# Patient Record
Sex: Female | Born: 1995 | Hispanic: Yes | Marital: Single | Smoking: Never smoker
Health system: Southern US, Community
[De-identification: ages and names within clinical notes are randomized; demographics above are authoritative.]

## PROBLEM LIST (undated history)

## (undated) ENCOUNTER — Inpatient Hospital Stay (HOSPITAL_COMMUNITY): Payer: Self-pay

## (undated) DIAGNOSIS — J45909 Unspecified asthma, uncomplicated: Secondary | ICD-10-CM

## (undated) DIAGNOSIS — R51 Headache: Secondary | ICD-10-CM

## (undated) DIAGNOSIS — R519 Headache, unspecified: Secondary | ICD-10-CM

## (undated) HISTORY — DX: Headache, unspecified: R51.9

## (undated) HISTORY — DX: Headache: R51

## (undated) HISTORY — PX: NO PAST SURGERIES: SHX2092

## (undated) HISTORY — DX: Unspecified asthma, uncomplicated: J45.909

---

## 2015-04-04 NOTE — L&D Delivery Note (Signed)
Patient is 20 y.o. G1P0 8531w1d admitted for contractions and rupture of membranes.   Delivery Note At 5:22 AM a viable female was delivered via Vaginal, Spontaneous Delivery (Presentation: Right Occiput Anterior).  APGAR: 9, 9; weight pending .   Placenta status: Intact, Spontaneous.  Cord: 3 vessels with the following complications: None.    Continued bleeding shortly after delivery, total EBL 600. I repaired a bleeding superficial perineal laceration. I swept the uterus twice, removing clot and small amount of placenta. I/O cath returned 250 cc urine. cytotec 800 buccal, methergine 0.2 mg IM, and hemagate 0.25 mg IM given. SSE revealed no cervical lacerations. Fundus firm. Will continue to monitor.  Anesthesia: Epidural  Episiotomy: None Lacerations:  1st Degree Suture Repair: 0 vicryl Est. Blood Loss (mL):  600  Mom to postpartum.  Baby to Couplet care / Skin to Skin.  [facility] 08/22/2015, 5:34 AM    OB FELLOW DELIVERY ATTESTATION  I was gloved and present for the delivery in its entirety, and I agree with the above resident's note.  i performed the repair and managed the PPH.  Silvano BilisNoah B Lenna Hagarty, MD 10:29 AM

## 2015-05-20 ENCOUNTER — Ambulatory Visit (INDEPENDENT_AMBULATORY_CARE_PROVIDER_SITE_OTHER): Payer: Medicaid Other

## 2015-05-20 DIAGNOSIS — Z3201 Encounter for pregnancy test, result positive: Secondary | ICD-10-CM | POA: Diagnosis not present

## 2015-05-20 DIAGNOSIS — Z3200 Encounter for pregnancy test, result unknown: Secondary | ICD-10-CM

## 2015-05-20 NOTE — Progress Notes (Signed)
Pt comes in today for pregnancy. Test has confirmed she is pregnant. LMP 11/27/2014. Pt wishes to start care here appt has been made.

## 2015-06-15 ENCOUNTER — Ambulatory Visit (INDEPENDENT_AMBULATORY_CARE_PROVIDER_SITE_OTHER): Payer: Medicaid Other | Admitting: Student

## 2015-06-15 ENCOUNTER — Encounter: Payer: Self-pay | Admitting: Student

## 2015-06-15 VITALS — BP 99/64 | HR 94 | Temp 98.6°F | Ht 62.0 in | Wt 139.4 lb

## 2015-06-15 DIAGNOSIS — Z23 Encounter for immunization: Secondary | ICD-10-CM

## 2015-06-15 DIAGNOSIS — O0933 Supervision of pregnancy with insufficient antenatal care, third trimester: Secondary | ICD-10-CM | POA: Diagnosis not present

## 2015-06-15 LAB — POCT URINALYSIS DIP (DEVICE)
Bilirubin Urine: NEGATIVE
Glucose, UA: NEGATIVE mg/dL
Hgb urine dipstick: NEGATIVE
Ketones, ur: NEGATIVE mg/dL
Nitrite: NEGATIVE
Protein, ur: NEGATIVE mg/dL
Specific Gravity, Urine: 1.015 (ref 1.005–1.030)
Urobilinogen, UA: 0.2 mg/dL (ref 0.0–1.0)
pH: 6.5 (ref 5.0–8.0)

## 2015-06-15 MED ORDER — TETANUS-DIPHTH-ACELL PERTUSSIS 5-2.5-18.5 LF-MCG/0.5 IM SUSP
0.5000 mL | Freq: Once | INTRAMUSCULAR | Status: AC
  Administered 2015-06-15: 0.5 mL via INTRAMUSCULAR

## 2015-06-15 NOTE — Patient Instructions (Signed)
Third Trimester of Pregnancy The third trimester is from week 29 through week 42, months 7 through 9. The third trimester is a time when the fetus is growing rapidly. At the end of the ninth month, the fetus is about 20 inches in length and weighs 6-10 pounds.  BODY CHANGES Your body goes through many changes during pregnancy. The changes vary from woman to woman.   Your weight will continue to increase. You can expect to gain 25-35 pounds (11-16 kg) by the end of the pregnancy.  You may begin to get stretch marks on your hips, abdomen, and breasts.  You may urinate more often because the fetus is moving lower into your pelvis and pressing on your bladder.  You may develop or continue to have heartburn as a result of your pregnancy.  You may develop constipation because certain hormones are causing the muscles that push waste through your intestines to slow down.  You may develop hemorrhoids or swollen, bulging veins (varicose veins).  You may have pelvic pain because of the weight gain and pregnancy hormones relaxing your joints between the bones in your pelvis. Backaches may result from overexertion of the muscles supporting your posture.  You may have changes in your hair. These can include thickening of your hair, rapid growth, and changes in texture. Some women also have hair loss during or after pregnancy, or hair that feels dry or thin. Your hair will most likely return to normal after your baby is born.  Your breasts will continue to grow and be tender. A yellow discharge may leak from your breasts called colostrum.  Your belly button may stick out.  You may feel short of breath because of your expanding uterus.  You may notice the fetus "dropping," or moving lower in your abdomen.  You may have a bloody mucus discharge. This usually occurs a few days to a week before labor begins.  Your cervix becomes thin and soft (effaced) near your due date. WHAT TO EXPECT AT YOUR PRENATAL  EXAMS  You will have prenatal exams every 2 weeks until week 36. Then, you will have weekly prenatal exams. During a routine prenatal visit:  You will be weighed to make sure you and the fetus are growing normally.  Your blood pressure is taken.  Your abdomen will be measured to track your baby's growth.  The fetal heartbeat will be listened to.  Any test results from the previous visit will be discussed.  You may have a cervical check near your due date to see if you have effaced. At around 36 weeks, your caregiver will check your cervix. At the same time, your caregiver will also perform a test on the secretions of the vaginal tissue. This test is to determine if a type of bacteria, Group B streptococcus, is present. Your caregiver will explain this further. Your caregiver may ask you:  What your birth plan is.  How you are feeling.  If you are feeling the baby move.  If you have had any abnormal symptoms, such as leaking fluid, bleeding, severe headaches, or abdominal cramping.  If you are using any tobacco products, including cigarettes, chewing tobacco, and electronic cigarettes.  If you have any questions. Other tests or screenings that may be performed during your third trimester include:  Blood tests that check for low iron levels (anemia).  Fetal testing to check the health, activity level, and growth of the fetus. Testing is done if you have certain medical conditions or if   there are problems during the pregnancy.  HIV (human immunodeficiency virus) testing. If you are at high risk, you may be screened for HIV during your third trimester of pregnancy. FALSE LABOR You may feel small, irregular contractions that eventually go away. These are called Braxton Hicks contractions, or false labor. Contractions may last for hours, days, or even weeks before true labor sets in. If contractions come at regular intervals, intensify, or become painful, it is best to be seen by your  caregiver.  SIGNS OF LABOR   Menstrual-like cramps.  Contractions that are 5 minutes apart or less.  Contractions that start on the top of the uterus and spread down to the lower abdomen and back.  A sense of increased pelvic pressure or back pain.  A watery or bloody mucus discharge that comes from the vagina. If you have any of these signs before the 37th week of pregnancy, call your caregiver right away. You need to go to the hospital to get checked immediately. HOME CARE INSTRUCTIONS   Avoid all smoking, herbs, alcohol, and unprescribed drugs. These chemicals affect the formation and growth of the baby.  Do not use any tobacco products, including cigarettes, chewing tobacco, and electronic cigarettes. If you need help quitting, ask your health care provider. You may receive counseling support and other resources to help you quit.  Follow your caregiver's instructions regarding medicine use. There are medicines that are either safe or unsafe to take during pregnancy.  Exercise only as directed by your caregiver. Experiencing uterine cramps is a good sign to stop exercising.  Continue to eat regular, healthy meals.  Wear a good support bra for breast tenderness.  Do not use hot tubs, steam rooms, or saunas.  Wear your seat belt at all times when driving.  Avoid raw meat, uncooked cheese, cat litter boxes, and soil used by cats. These carry germs that can cause birth defects in the baby.  Take your prenatal vitamins.  Take 1500-2000 mg of calcium daily starting at the 20th week of pregnancy until you deliver your baby.  Try taking a stool softener (if your caregiver approves) if you develop constipation. Eat more high-fiber foods, such as fresh vegetables or fruit and whole grains. Drink plenty of fluids to keep your urine clear or pale yellow.  Take warm sitz baths to soothe any pain or discomfort caused by hemorrhoids. Use hemorrhoid cream if your caregiver approves.  If  you develop varicose veins, wear support hose. Elevate your feet for 15 minutes, 3-4 times a day. Limit salt in your diet.  Avoid heavy lifting, wear low heal shoes, and practice good posture.  Rest a lot with your legs elevated if you have leg cramps or low back pain.  Visit your dentist if you have not gone during your pregnancy. Use a soft toothbrush to brush your teeth and be gentle when you floss.  A sexual relationship may be continued unless your caregiver directs you otherwise.  Do not travel far distances unless it is absolutely necessary and only with the approval of your caregiver.  Take prenatal classes to understand, practice, and ask questions about the labor and delivery.  Make a trial run to the hospital.  Pack your hospital bag.  Prepare the baby's nursery.  Continue to go to all your prenatal visits as directed by your caregiver. SEEK MEDICAL CARE IF:  You are unsure if you are in labor or if your water has broken.  You have dizziness.  You have  mild pelvic cramps, pelvic pressure, or nagging pain in your abdominal area.  You have persistent nausea, vomiting, or diarrhea.  You have a bad smelling vaginal discharge.  You have pain with urination. SEEK IMMEDIATE MEDICAL CARE IF:   You have a fever.  You are leaking fluid from your vagina.  You have spotting or bleeding from your vagina.  You have severe abdominal cramping or pain.  You have rapid weight loss or gain.  You have shortness of breath with chest pain.  You notice sudden or extreme swelling of your face, hands, ankles, feet, or legs.  You have not felt your baby move in over an hour.  You have severe headaches that do not go away with medicine.  You have vision changes.   This information is not intended to replace advice given to you by your health care provider. Make sure you discuss any questions you have with your health care provider.   Document Released: 03/14/2001 Document  Revised: 04/10/2014 Document Reviewed: 05/21/2012 Elsevier Interactive Patient Education 2016 [employer]   Safe Medications in Pregnancy   Acne: Benzoyl Peroxide Salicylic Acid  Backache/Headache: Tylenol: 2 regular strength every 4 hours OR              2 Extra strength every 6 hours  Colds/Coughs/Allergies: Benadryl (alcohol free) 25 mg every 6 hours as needed Breath right strips Claritin Cepacol throat lozenges Chloraseptic throat spray Cold-Eeze- up to three times per day Cough drops, alcohol free Flonase (by prescription only) Guaifenesin Mucinex Robitussin DM (plain only, alcohol free) Saline nasal spray/drops Sudafed (pseudoephedrine) & Actifed ** use only after [redacted] weeks gestation and if you do not have high blood pressure Tylenol Vicks Vaporub Zinc lozenges Zyrtec   Constipation: Colace Ducolax suppositories Fleet enema Glycerin suppositories Metamucil Milk of magnesia Miralax Senokot Smooth move tea  Diarrhea: Kaopectate Imodium A-D  *NO pepto Bismol  Hemorrhoids: Anusol Anusol HC Preparation H Tucks  Indigestion: Tums Maalox Mylanta Zantac  Pepcid  Insomnia: Benadryl (alcohol free) 25mg  every 6 hours as needed Tylenol PM Unisom, no Gelcaps  Leg Cramps: Tums MagGel  Nausea/Vomiting:  Bonine Dramamine Emetrol Ginger extract Sea bands Meclizine  Nausea medication to take during pregnancy:  Unisom (doxylamine succinate 25 mg tablets) Take one tablet daily at bedtime. If symptoms are not adequately controlled, the dose can be increased to a maximum recommended dose of two tablets daily (1/2 tablet in the morning, 1/2 tablet mid-afternoon and one at bedtime). Vitamin B6 100mg  tablets. Take one tablet twice a day (up to 200 mg per day).  Skin Rashes: Aveeno products Benadryl cream or 25mg  every 6 hours as needed Calamine Lotion 1% cortisone cream  Yeast infection: Gyne-lotrimin 7 Monistat 7  Gum/tooth  pain: Anbesol  **If taking multiple medications, please check labels to avoid duplicating the same active ingredients **take medication as directed on the label ** Do not exceed 4000 mg of tylenol in 24 hours **Do not take medications that contain aspirin or ibuprofen

## 2015-06-15 NOTE — Progress Notes (Signed)
  Subjective:    Kathleen Cooke is being seen today for her first obstetrical visit.  This is a planned pregnancy. She is at 8050w5d gestation. Her obstetrical history is significant for late to care. Relationship with FOB: not involved. Patient does intend to breast feed. Pregnancy history fully reviewed.  Patient reports no complaints.  Review of Systems:   Review of Systems Review of Systems - History obtained from the patient Respiratory ROS: no cough, shortness of breath, or wheezing Cardiovascular ROS: no chest pain or dyspnea on exertion Gastrointestinal ROS: no abdominal pain, change in bowel habits, or black or bloody stools Genito-Urinary ROS: negative  Objective:     BP 99/64 mmHg  Pulse 94  Temp(Src) 98.6 F (37 C)  Ht 5\' 2"  (1.575 m)  Wt 139 lb 6.4 oz (63.231 kg)  BMI 25.49 kg/m2  LMP 11/26/2014 (Approximate) Physical Exam  Exam  Physical Examination: General appearance - alert, well appearing, and in no distress, oriented to person, place, and time and normal appearing weight Mental status - alert, oriented to person, place, and time Mouth - dental hygiene good Neck - supple, no significant adenopathy, thyroid exam: thyroid is normal in size without nodules or tenderness Lymphatics - no palpable lymphadenopathy Heart - normal rate, regular rhythm, normal S1, S2, no murmurs, rubs, clicks or gallops Abdomen - soft, nontender, nondistended, no masses or organomegaly Pelvic - normal external genitalia, vulva, vagina, cervix, uterus and adnexa, VAGINA: normal appearing vagina with normal color and discharge, no lesions Musculoskeletal - no joint tenderness, deformity or swelling, full range of motion without pain  Assessment:    Pregnancy: G1P0 Patient Active Problem List   Diagnosis Date Noted  . Late prenatal care affecting pregnancy in third trimester 06/15/2015       Plan:  1. Late prenatal care affecting pregnancy in third trimester  - Prenatal  Profile - Hemoglobinopathy evaluation - Cystic fibrosis diagnostic study - GC[facility]Chlamydia probe amp [facility] - Prescript Monitor Profile(19) - Culture, OB Urine - US MFM OB COMP + 14 WK; Future - Glucose Tolerance, 1 HR (50g) w/o Fasting  2. Needs flu shot  - Flu Vaccine QUAD 36+ mos IM; Standing - Flu Vaccine QUAD 36+ mos IM  3. Need for Tdap vaccination  - Tdap (BOOSTRIX) injection 0.5 mL; Inject 0.5 mLs into the muscle once.    Initial labs drawn. Prenatal vitamins. Problem list reviewed and updated.    Kathleen Cooke 06/15/2015

## 2015-06-15 NOTE — Progress Notes (Signed)
Initial prenatal education packet given Breastfeeding tip of the week reviewed 1hr gtt, initial prenatal labs today Flu and Tdap today

## 2015-06-16 ENCOUNTER — Ambulatory Visit (HOSPITAL_COMMUNITY)
Admission: RE | Admit: 2015-06-16 | Discharge: 2015-06-16 | Disposition: A | Discharge status: Home / Self Care | Payer: Medicaid Other | Source: Ambulatory Visit | Attending: Student | Admitting: Student

## 2015-06-16 DIAGNOSIS — Z3A30 30 weeks gestation of pregnancy: Secondary | ICD-10-CM | POA: Diagnosis not present

## 2015-06-16 DIAGNOSIS — O0933 Supervision of pregnancy with insufficient antenatal care, third trimester: Secondary | ICD-10-CM

## 2015-06-16 DIAGNOSIS — Z36 Encounter for antenatal screening of mother: Secondary | ICD-10-CM | POA: Diagnosis not present

## 2015-06-16 LAB — PRENATAL PROFILE (SOLSTAS)
Antibody Screen: NEGATIVE
Basophils Absolute: 0 10*3/uL (ref 0.0–0.1)
Basophils Relative: 0 % (ref 0–1)
Eosinophils Absolute: 0.1 10*3/uL (ref 0.0–0.7)
Eosinophils Relative: 1 % (ref 0–5)
HCT: 35.5 % — ABNORMAL LOW (ref 36.0–46.0)
HIV 1&2 Ab, 4th Generation: NONREACTIVE
Hemoglobin: 11.7 g/dL — ABNORMAL LOW (ref 12.0–15.0)
Hepatitis B Surface Ag: NEGATIVE
Lymphocytes Relative: 25 % (ref 12–46)
Lymphs Abs: 2.6 10*3/uL (ref 0.7–4.0)
MCH: 29 pg (ref 26.0–34.0)
MCHC: 33 g/dL (ref 30.0–36.0)
MCV: 87.9 fL (ref 78.0–100.0)
MPV: 11.2 fL (ref 8.6–12.4)
Monocytes Absolute: 0.5 10*3/uL (ref 0.1–1.0)
Monocytes Relative: 5 % (ref 3–12)
Neutro Abs: 7.1 10*3/uL (ref 1.7–7.7)
Neutrophils Relative %: 69 % (ref 43–77)
Platelets: 254 10*3/uL (ref 150–400)
RBC: 4.04 MIL/uL (ref 3.87–5.11)
RDW: 13.5 % (ref 11.5–15.5)
Rh Type: POSITIVE
Rubella: 2.1 Index — ABNORMAL HIGH (ref <0.90)
WBC: 10.3 10*3/uL (ref 4.0–10.5)

## 2015-06-16 LAB — GLUCOSE TOLERANCE, 1 HOUR (50G) W/O FASTING: Glucose, 1 Hr, gestational: 100 mg/dL (ref <140)

## 2015-06-17 LAB — PRESCRIPTION MONITORING PROFILE (19 PANEL)
Amphetamine/Meth: NEGATIVE ng/mL
Barbiturate Screen, Urine: NEGATIVE ng/mL
Benzodiazepine Screen, Urine: NEGATIVE ng/mL
Buprenorphine, Urine: NEGATIVE ng/mL
Cannabinoid Scrn, Ur: NEGATIVE ng/mL
Carisoprodol, Urine: NEGATIVE ng/mL
Cocaine Metabolites: NEGATIVE ng/mL
Creatinine, Urine: 43.33 mg/dL (ref >20.0)
Fentanyl, Ur: NEGATIVE ng/mL
MDMA URINE: NEGATIVE ng/mL
Meperidine, Ur: NEGATIVE ng/mL
Methadone Screen, Urine: NEGATIVE ng/mL
Methaqualone: NEGATIVE ng/mL
Nitrites, Initial: NEGATIVE ug/mL
Opiate Screen, Urine: NEGATIVE ng/mL
Oxycodone Screen, Ur: NEGATIVE ng/mL
Phencyclidine, Ur: NEGATIVE ng/mL
Propoxyphene: NEGATIVE ng/mL
Tapentadol, urine: NEGATIVE ng/mL
Tramadol Scrn, Ur: NEGATIVE ng/mL
Zolpidem, Urine: NEGATIVE ng/mL
pH, Initial: 6.6 pH (ref 4.5–8.9)

## 2015-06-17 LAB — CULTURE, OB URINE
Colony Count: NO GROWTH
Organism ID, Bacteria: NO GROWTH

## 2015-06-17 LAB — HEMOGLOBINOPATHY EVALUATION
Hemoglobin Other: 0 %
Hgb A2 Quant: 2.7 % (ref 2.2–3.2)
Hgb A: 97.3 % (ref 96.8–97.8)
Hgb F Quant: 0 % (ref 0.0–2.0)
Hgb S Quant: 0 %

## 2015-06-18 LAB — CYSTIC FIBROSIS DIAGNOSTIC STUDY

## 2015-06-22 ENCOUNTER — Encounter (HOSPITAL_COMMUNITY): Payer: Self-pay | Admitting: *Deleted

## 2015-06-22 ENCOUNTER — Telehealth: Payer: Self-pay | Admitting: Student

## 2015-06-22 ENCOUNTER — Inpatient Hospital Stay (HOSPITAL_COMMUNITY)
Admission: AD | Admit: 2015-06-22 | Discharge: 2015-06-22 | Disposition: A | Discharge status: Home / Self Care | Payer: Medicaid Other | Source: Ambulatory Visit | Attending: Obstetrics & Gynecology | Admitting: Obstetrics & Gynecology

## 2015-06-22 ENCOUNTER — Encounter: Payer: Self-pay | Admitting: Student

## 2015-06-22 DIAGNOSIS — Z8709 Personal history of other diseases of the respiratory system: Secondary | ICD-10-CM

## 2015-06-22 DIAGNOSIS — Z3A29 29 weeks gestation of pregnancy: Secondary | ICD-10-CM | POA: Diagnosis not present

## 2015-06-22 DIAGNOSIS — J069 Acute upper respiratory infection, unspecified: Secondary | ICD-10-CM | POA: Diagnosis not present

## 2015-06-22 DIAGNOSIS — J45909 Unspecified asthma, uncomplicated: Secondary | ICD-10-CM | POA: Insufficient documentation

## 2015-06-22 DIAGNOSIS — O9989 Other specified diseases and conditions complicating pregnancy, childbirth and the puerperium: Secondary | ICD-10-CM

## 2015-06-22 DIAGNOSIS — J029 Acute pharyngitis, unspecified: Secondary | ICD-10-CM | POA: Insufficient documentation

## 2015-06-22 DIAGNOSIS — O99513 Diseases of the respiratory system complicating pregnancy, third trimester: Secondary | ICD-10-CM | POA: Insufficient documentation

## 2015-06-22 DIAGNOSIS — J101 Influenza due to other identified influenza virus with other respiratory manifestations: Secondary | ICD-10-CM

## 2015-06-22 LAB — INFLUENZA PANEL BY PCR (TYPE A & B)
H1N1 flu by pcr: NOT DETECTED
Influenza A By PCR: NEGATIVE
Influenza B By PCR: POSITIVE — AB

## 2015-06-22 LAB — RAPID STREP SCREEN (MED CTR MEBANE ONLY): Streptococcus, Group A Screen (Direct): NEGATIVE

## 2015-06-22 MED ORDER — ALBUTEROL SULFATE HFA 108 (90 BASE) MCG/ACT IN AERS: 1.0000 | INHALATION_SPRAY | Freq: Four times a day (QID) | RESPIRATORY_TRACT | Status: DC | PRN

## 2015-06-22 MED ORDER — OSELTAMIVIR PHOSPHATE 75 MG PO CAPS: 75.0000 mg | ORAL_CAPSULE | Freq: Two times a day (BID) | ORAL | Status: DC

## 2015-06-22 MED ORDER — BENZONATATE 100 MG PO CAPS: 100.0000 mg | ORAL_CAPSULE | Freq: Three times a day (TID) | ORAL | Status: DC

## 2015-06-22 NOTE — Telephone Encounter (Signed)
Pt notified of positive Flu result. Tamiflu rx sent in to pharmacy. Work note left at front desk for patient. Questions answered.

## 2015-06-22 NOTE — Discharge Instructions (Signed)
Pharyngitis Pharyngitis is redness, pain, and swelling (inflammation) of your pharynx.  CAUSES  Pharyngitis is usually caused by infection. Most of the time, these infections are from viruses (viral) and are part of a cold. However, sometimes pharyngitis is caused by bacteria (bacterial). Pharyngitis can also be caused by allergies. Viral pharyngitis may be spread from person to person by coughing, sneezing, and personal items or utensils (cups, forks, spoons, toothbrushes). Bacterial pharyngitis may be spread from person to person by more intimate contact, such as kissing.  SIGNS AND SYMPTOMS  Symptoms of pharyngitis include:   Sore throat.   Tiredness (fatigue).   Low-grade fever.   Headache.  Joint pain and muscle aches.  Skin rashes.  Swollen lymph nodes.  Plaque-like film on throat or tonsils (often seen with bacterial pharyngitis). DIAGNOSIS  Your health care provider will ask you questions about your illness and your symptoms. Your medical history, along with a physical exam, is often all that is needed to diagnose pharyngitis. Sometimes, a rapid strep test is done. Other lab tests may also be done, depending on the suspected cause.  TREATMENT  Viral pharyngitis will usually get better in 3-4 days without the use of medicine. Bacterial pharyngitis is treated with medicines that kill germs (antibiotics).  HOME CARE INSTRUCTIONS   Drink enough water and fluids to keep your urine clear or pale yellow.   Only take over-the-counter or prescription medicines as directed by your health care provider:   If you are prescribed antibiotics, make sure you finish them even if you start to feel better.   Do not take aspirin.   Get lots of rest.   Gargle with 8 oz of salt water ( tsp of salt per 1 qt of water) as often as every 1-2 hours to soothe your throat.   Throat lozenges (if you are not at risk for choking) or sprays may be used to soothe your throat. SEEK MEDICAL  CARE IF:   You have large, tender lumps in your neck.  You have a rash.  You cough up green, yellow-brown, or bloody spit. SEEK IMMEDIATE MEDICAL CARE IF:   Your neck becomes stiff.  You drool or are unable to swallow liquids.  You vomit or are unable to keep medicines or liquids down.  You have severe pain that does not go away with the use of recommended medicines.  You have trouble breathing (not caused by a stuffy nose). MAKE SURE YOU:   Understand these instructions.  Will watch your condition.  Will get help right away if you are not doing well or get worse.   This information is not intended to replace advice given to you by your health care provider. Make sure you discuss any questions you have with your health care provider.   Document Released: 03/20/2005 Document Revised: 01/08/2013 Document Reviewed: 11/25/2012 Elsevier Interactive Patient Education 2016 [employer]     Safe Medications in Pregnancy   Acne: Benzoyl Peroxide Salicylic Acid  Backache/Headache: Tylenol: 2 regular strength every 4 hours OR              2 Extra strength every 6 hours  Colds/Coughs/Allergies: Benadryl (alcohol free) 25 mg every 6 hours as needed Breath right strips Claritin Cepacol throat lozenges Chloraseptic throat spray Cold-Eeze- up to three times per day Cough drops, alcohol free Flonase (by prescription only) Guaifenesin Mucinex Robitussin DM (plain only, alcohol free) Saline nasal spray/drops Sudafed (pseudoephedrine) & Actifed ** use only after [redacted] weeks gestation and if  you do not have high blood pressure Tylenol Vicks Vaporub Zinc lozenges Zyrtec   Constipation: Colace Ducolax suppositories Fleet enema Glycerin suppositories Metamucil Milk of magnesia Miralax Senokot Smooth move tea  Diarrhea: Kaopectate Imodium A-D  *NO pepto Bismol  Hemorrhoids: Anusol Anusol HC Preparation H Tucks  Indigestion: Tums Maalox Mylanta Zantac    Pepcid  Insomnia: Benadryl (alcohol free) 25mg  every 6 hours as needed Tylenol PM Unisom, no Gelcaps  Leg Cramps: Tums MagGel  Nausea/Vomiting:  Bonine Dramamine Emetrol Ginger extract Sea bands Meclizine  Nausea medication to take during pregnancy:  Unisom (doxylamine succinate 25 mg tablets) Take one tablet daily at bedtime. If symptoms are not adequately controlled, the dose can be increased to a maximum recommended dose of two tablets daily (1/2 tablet in the morning, 1/2 tablet mid-afternoon and one at bedtime). Vitamin B6 100mg  tablets. Take one tablet twice a day (up to 200 mg per day).  Skin Rashes: Aveeno products Benadryl cream or 25mg  every 6 hours as needed Calamine Lotion 1% cortisone cream  Yeast infection: Gyne-lotrimin 7 Monistat 7  Gum/tooth pain: Anbesol  **If taking multiple medications, please check labels to avoid duplicating the same active ingredients **take medication as directed on the label ** Do not exceed 4000 mg of tylenol in 24 hours **Do not take medications that contain aspirin or ibuprofen

## 2015-06-22 NOTE — MAU Note (Addendum)
Pt C/O cold sx's, ears are hurting, throat hurts, coughing - all since last week.  Denies fever.  Pt has not tried any cold meds.

## 2015-06-22 NOTE — MAU Note (Signed)
Assumed care of patient. Denies any complications associated with pregnancy.

## 2015-06-22 NOTE — MAU Provider Note (Signed)
History     CSN: [id number]  Arrival date and time: 06/22/15 4098   First Provider Initiated Contact with Patient 06/22/15 1051         Chief Complaint  Patient presents with  . URI     HPI Comments: Kathleen Cooke is a 20 y.o. G1P0 at [redacted]w[redacted]d who presents for cold-like symptoms. Reports symptoms began this weekend after her mom was "sick with an infection". Symptoms include right ear pain, sore throat, rhinorrhea, congestion, and non productive cough. Patient denies fever/chills, body aches, abdominal pain, vaginal bleeding, or LOF. Positive fetal movement.  Has not taken any medications to treat her symptoms.  PMH significant for asthma; no longer has an albuterol inhaler b/c she ran out and couldn't get it refilled when she didn't have insurance. Denies wheezing, SOB, or chest pain. Does reports feeling "tight" at night when coughing is worse.    OB History    Gravida Para Term Preterm AB TAB SAB Ectopic Multiple Living   1 0        0      Past Medical History  Diagnosis Date  . Asthma   . Headache     Past Surgical History  Procedure Laterality Date  . No past surgeries      Family History  Problem Relation Age of Onset  . Birth defects Neg Hx   . Diabetes Neg Hx   . Early death Neg Hx   . Heart disease Neg Hx   . Hypertension Neg Hx   . Stroke Neg Hx   . Miscarriages / Stillbirths Neg Hx     Social History  Substance Use Topics  . Smoking status: Never Smoker   . Smokeless tobacco: Never Used  . Alcohol Use: No    Allergies: No Known Allergies  Prescriptions prior to admission  Medication Sig Dispense Refill Last Dose  . Prenatal Vit-Fe Fumarate-FA (PRENATAL MULTIVITAMIN) TABS tablet Take 1 tablet by mouth daily at 12 noon.   06/21/2015 at Unknown time    Review of Systems  Constitutional: Negative for fever, chills and diaphoresis.  HENT: Positive for congestion, ear pain and sore throat. Negative for ear discharge and tinnitus.   Respiratory:  Positive for cough. Negative for hemoptysis, sputum production, shortness of breath and wheezing.   Cardiovascular: Negative.   Gastrointestinal: Negative.   Genitourinary: Negative.   Musculoskeletal: Negative for myalgias.  Neurological: Negative for headaches.   Physical Exam   Blood pressure 96/58, pulse 89, temperature 97.8 F (36.6 C), temperature source Oral, resp. rate 16, last menstrual period 11/26/2014, SpO2 99 %.  Physical Exam  Nursing note and vitals reviewed. Constitutional: She is oriented to person, place, and time. She appears well-developed and well-nourished. No distress.  HENT:  Head: Normocephalic and atraumatic.  Right Ear: Tympanic membrane normal.  Left Ear: Tympanic membrane normal.  Nose: Mucosal edema and rhinorrhea present. No epistaxis. Right sinus exhibits no maxillary sinus tenderness and no frontal sinus tenderness. Left sinus exhibits no maxillary sinus tenderness and no frontal sinus tenderness.  Mouth/Throat: Uvula is midline and mucous membranes are normal. Posterior oropharyngeal erythema present. No oropharyngeal exudate, posterior oropharyngeal edema or tonsillar abscesses.  Eyes: Conjunctivae are normal. Right eye exhibits no discharge. Left eye exhibits no discharge. No scleral icterus.  Neck: Normal range of motion.  Cardiovascular: Normal rate, regular rhythm and normal heart sounds.   No murmur heard. Respiratory: Effort normal and breath sounds normal. No respiratory distress. She has no wheezes.  Lymphadenopathy:  Head (right side): Submandibular adenopathy present. No preauricular and no posterior auricular adenopathy present.       Head (left side): Submandibular adenopathy present. No preauricular and no posterior auricular adenopathy present.  Neurological: She is alert and oriented to person, place, and time.  Skin: Skin is warm and dry. She is not diaphoretic.  Psychiatric: She has a normal mood and affect. Her behavior is  normal. Judgment and thought content normal.   Fetal Tracing:  Baseline: 130 Variability: moderate Accelerations: 15x15 Decelerations: none  Toco: none   MAU Course  Procedures Results for orders placed or performed during the hospital encounter of 06/22/15 (from the past 24 hour(s))  Rapid strep screen     Status: None   Collection Time: 06/22/15 10:56 AM  Result Value Ref Range   Streptococcus, Group A Screen (Direct) NEGATIVE NEGATIVE  Influenza panel by PCR (type A & B, H1N1)     Status: Abnormal   Collection Time: 06/22/15 10:56 AM  Result Value Ref Range   Influenza A By PCR NEGATIVE NEGATIVE   Influenza B By PCR POSITIVE (A) NEGATIVE   H1N1 flu by pcr NOT DETECTED NOT DETECTED    MDM Flu & strep swabs collected Reactive tracing, no contractions Will rx albuterol inhaler to have on hand if needed  Assessment and Plan  A: 1. Acute upper respiratory infection   2. Pharyngitis   3. History of asthma     P: Discharge home Rx benzonatate & albuterol inhaler Flu & strep swabs pending Discussed reasons to return Discussed use of OTC meds for symptomatic treatment  Judeth Hornrin Tove Wideman 06/22/2015, 10:51 AM

## 2015-06-24 LAB — CULTURE, GROUP A STREP (THRC)

## 2015-06-29 ENCOUNTER — Ambulatory Visit (INDEPENDENT_AMBULATORY_CARE_PROVIDER_SITE_OTHER): Payer: Medicaid Other | Admitting: Family

## 2015-06-29 VITALS — BP 102/65 | HR 80 | Temp 98.4°F | Wt 140.4 lb

## 2015-06-29 DIAGNOSIS — O0933 Supervision of pregnancy with insufficient antenatal care, third trimester: Secondary | ICD-10-CM | POA: Diagnosis not present

## 2015-06-29 DIAGNOSIS — R8271 Bacteriuria: Secondary | ICD-10-CM

## 2015-06-29 LAB — POCT URINALYSIS DIP (DEVICE)
Bilirubin Urine: NEGATIVE
Glucose, UA: NEGATIVE mg/dL
Hgb urine dipstick: NEGATIVE
Ketones, ur: NEGATIVE mg/dL
Nitrite: NEGATIVE
Protein, ur: NEGATIVE mg/dL
Specific Gravity, Urine: 1.02 (ref 1.005–1.030)
Urobilinogen, UA: 0.2 mg/dL (ref 0.0–1.0)
pH: 6 (ref 5.0–8.0)

## 2015-06-29 NOTE — Progress Notes (Signed)
Subjective:  Kathleen Cooke is a 20 y.o. G1P0 at 6475w5d being seen today for ongoing prenatal care.  She is currently monitored for the following issues for this low-risk pregnancy and has Late prenatal care affecting pregnancy in third trimester on her problem list.  Patient reports no complaints.   Denies UTI symptoms.   Contractions: Not present. Vag. Bleeding: None.  Movement: Present. Denies leaking of fluid.   The following portions of the patient's history were reviewed and updated as appropriate: allergies, current medications, past family history, past medical history, past social history, past surgical history and problem list. Problem list updated.  Objective:   Filed Vitals:   06/29/15 0940  BP: 102/65  Pulse: 80  Temp: 98.4 F (36.9 C)  Weight: 140 lb 6.4 oz (63.685 kg)    Fetal Status: Fetal Heart Rate (bpm): 162 Fundal Height: 29 cm Movement: Present     General:  Alert, oriented and cooperative. Patient is in no acute distress.  Skin: Skin is warm and dry. No rash noted.   Cardiovascular: Normal heart rate noted  Respiratory: Normal respiratory effort, no problems with respiration noted  Abdomen: Soft, gravid, appropriate for gestational age. Pain/Pressure: Present     Pelvic: Vag. Bleeding: None     Cervical exam deferred        Extremities: Normal range of motion.  Edema: None  Mental Status: Normal mood and affect. Normal behavior. Normal judgment and thought content.   Urinalysis:      Assessment and Plan:  Pregnancy: G1P0 at 675w5d  1. Late prenatal care affecting pregnancy in third trimester - US MFM OB FOLLOW UP; Future > poor view of anatomy  2. Bacteria in urine - Culture, OB Urine - Reviewed third trimester labs with patient.     Preterm labor symptoms and general obstetric precautions including but not limited to vaginal bleeding, contractions, leaking of fluid and fetal movement were reviewed in detail with the patient. Please refer to After  Visit Summary for other counseling recommendations.  Return in about 2 weeks (around 07/13/2015).   Eino FarberWalidah Kennith GainN Karim, CNM

## 2015-06-29 NOTE — Progress Notes (Signed)
Breastfeeding tip of the week reviewed Schedule f/u u/s

## 2015-06-29 NOTE — Patient Instructions (Signed)
AREA PEDIATRIC/FAMILY PRACTICE PHYSICIANS  [facility] [address] [place], [place] [address] Phone - [phone]   Fax - [phone]  [facility] [address] [place], [place]  [address] Phone - [phone]   Fax - [phone]  [facility] [address] [place], [place]  [address] Phone - [phone]   Fax - [phone]  [facility] [address] [place], [place]  [address] Phone - [phone]   Fax - [phone]  [facility] [address] [place], [place]  [address] Phone - [phone]   Fax - [phone]  [facility] [address] [place], [place]  [address] Phone - [phone]   Fax - [phone]  [facility] [address] [place], [place]  [address] Phone - [phone]   Fax - [phone]  [facility] [address] [place], [place]  [address] Phone - [phone]   Fax - [phone]  [facility] [address] [place], [place]  [address] Phone - [phone]   Fax - [phone] [facility] [address] [place], [place]  [address] Phone - [phone]   Fax - [phone]  [facility] [address] [place], [place]  [address] Phone - [phone]   Fax - [phone]  [facility] [address] [place], [place]  [address] Phone - [phone]   Fax - [phone]  [facility] [address] [place], [place]  [address] Phone - [phone]   Fax - [phone]  [facility] [address] [place], [place]  [address] Phone - [phone]  [facility] [address] [place], [place]  [address] Phone - [phone]   Fax - [phone]  [facility] [address] [place], [place]  [address] Phone - [phone]   Fax - [phone]  [facility] [address] [place], [place]  [address] Phone - [phone]   Fax - [phone]  [facility] [address] [place], [place]  [address] Phone - [phone]   Fax - [phone]   [facility] [address] [place], [place]  [address] Phone - [phone]   Fax - [phone]  [facility] [address] [place], [place]  [address] Phone - [phone]   Fax - [phone]  [facility] [address] , [place]  [address] Phone - [phone]   Fax - [phone]  [facility] [address] , [place]  [address] Phone - [phone]   Fax - [phone]  [facility] [address] , [place]  [address] Phone - [phone]   Fax - [phone] [facility] [address] [place], [place]  [address] Phone - [phone]   Fax - [phone]  [facility] [address] [place], [place]  [address] Phone - [phone]   Fax - [phone]  [facility] [address] ,   [address] Phone - [phone]   Fax - [phone]   

## 2015-06-30 LAB — CULTURE, OB URINE
Colony Count: NO GROWTH
Organism ID, Bacteria: NO GROWTH

## 2015-07-01 LAB — CULTURE, OB URINE

## 2015-07-05 ENCOUNTER — Telehealth: Payer: Self-pay

## 2015-07-05 NOTE — Telephone Encounter (Signed)
Pt called stating she is having diarrhea and vomiting for the last two days. I explained to patient she may have the stomach virus which can last up to 48 hours. Advised her to try BRAT diet for the reminder of the day. If symptoms should worsen she should call for an appointment or go to nearest urgent care [facility].

## 2015-07-13 ENCOUNTER — Ambulatory Visit (INDEPENDENT_AMBULATORY_CARE_PROVIDER_SITE_OTHER): Payer: Medicaid Other | Admitting: Family

## 2015-07-13 VITALS — BP 109/64 | HR 76 | Temp 98.5°F | Wt 142.7 lb

## 2015-07-13 DIAGNOSIS — O0933 Supervision of pregnancy with insufficient antenatal care, third trimester: Secondary | ICD-10-CM | POA: Diagnosis not present

## 2015-07-13 LAB — POCT URINALYSIS DIP (DEVICE)
Bilirubin Urine: NEGATIVE
Glucose, UA: NEGATIVE mg/dL
Hgb urine dipstick: NEGATIVE
Ketones, ur: NEGATIVE mg/dL
Nitrite: NEGATIVE
Protein, ur: NEGATIVE mg/dL
Specific Gravity, Urine: 1.025 (ref 1.005–1.030)
Urobilinogen, UA: 0.2 mg/dL (ref 0.0–1.0)
pH: 7 (ref 5.0–8.0)

## 2015-07-13 NOTE — Progress Notes (Signed)
Urine: sm ant wbcs Breastfeeding tip of the week reviewed

## 2015-07-13 NOTE — Progress Notes (Signed)
Subjective:  Kathleen Cooke is a 20 y.o. G1P0 at 6266w5d being seen today for ongoing prenatal care.  She is currently monitored for the following issues for this low-risk pregnancy and has Late prenatal care affecting pregnancy in third trimester on her problem list.  Patient reports no complaints.  Contractions: Not present. Vag. Bleeding: None.  Movement: Present. Denies leaking of fluid.   The following portions of the patient's history were reviewed and updated as appropriate: allergies, current medications, past family history, past medical history, past social history, past surgical history and problem list. Problem list updated.  Objective:   Filed Vitals:   07/13/15 0958  BP: 109/64  Pulse: 76  Temp: 98.5 F (36.9 C)  Weight: 142 lb 11.2 oz (64.728 kg)    Fetal Status: Fetal Heart Rate (bpm): 138 Fundal Height: 33 cm Movement: Present     General:  Alert, oriented and cooperative. Patient is in no acute distress.  Skin: Skin is warm and dry. No rash noted.   Cardiovascular: Normal heart rate noted  Respiratory: Normal respiratory effort, no problems with respiration noted  Abdomen: Soft, gravid, appropriate for gestational age. Pain/Pressure: Present     Pelvic: Vag. Bleeding: None     Cervical exam deferred        Extremities: Normal range of motion.  Edema: None  Mental Status: Normal mood and affect. Normal behavior. Normal judgment and thought content.   Urinalysis: Urine Protein: Negative Urine Glucose: Negative  Assessment and Plan:  Pregnancy: G1P0 at 466w5d  1. Late prenatal care affecting pregnancy in third trimester - Follow-up anatomy scheduled for tomorrow for poor view at last scan  Preterm labor symptoms and general obstetric precautions including but not limited to vaginal bleeding, contractions, leaking of fluid and fetal movement were reviewed in detail with the patient. Please refer to After Visit Summary for other counseling recommendations.  Return  in about 2 weeks (around 07/27/2015).   Eino FarberWalidah Kennith GainN Karim, CNM

## 2015-07-14 ENCOUNTER — Other Ambulatory Visit: Payer: Self-pay | Admitting: Family

## 2015-07-14 ENCOUNTER — Encounter (HOSPITAL_COMMUNITY): Payer: Self-pay

## 2015-07-14 ENCOUNTER — Ambulatory Visit (HOSPITAL_COMMUNITY)
Admission: RE | Admit: 2015-07-14 | Discharge: 2015-07-14 | Disposition: A | Discharge status: Home / Self Care | Payer: Medicaid Other | Source: Ambulatory Visit | Attending: Family | Admitting: Family

## 2015-07-14 DIAGNOSIS — O0933 Supervision of pregnancy with insufficient antenatal care, third trimester: Secondary | ICD-10-CM

## 2015-07-14 DIAGNOSIS — Z36 Encounter for antenatal screening of mother: Secondary | ICD-10-CM | POA: Insufficient documentation

## 2015-07-14 DIAGNOSIS — Z3A34 34 weeks gestation of pregnancy: Secondary | ICD-10-CM | POA: Insufficient documentation

## 2015-07-14 DIAGNOSIS — O99519 Diseases of the respiratory system complicating pregnancy, unspecified trimester: Secondary | ICD-10-CM

## 2015-07-14 DIAGNOSIS — J45909 Unspecified asthma, uncomplicated: Secondary | ICD-10-CM | POA: Diagnosis not present

## 2015-07-14 DIAGNOSIS — IMO0002 Reserved for concepts with insufficient information to code with codable children: Secondary | ICD-10-CM

## 2015-07-14 DIAGNOSIS — O99513 Diseases of the respiratory system complicating pregnancy, third trimester: Secondary | ICD-10-CM | POA: Insufficient documentation

## 2015-07-14 DIAGNOSIS — Z0489 Encounter for examination and observation for other specified reasons: Secondary | ICD-10-CM

## 2015-07-27 ENCOUNTER — Ambulatory Visit (INDEPENDENT_AMBULATORY_CARE_PROVIDER_SITE_OTHER): Payer: Medicaid Other | Admitting: Obstetrics and Gynecology

## 2015-07-27 ENCOUNTER — Other Ambulatory Visit (HOSPITAL_COMMUNITY)
Admission: RE | Admit: 2015-07-27 | Discharge: 2015-07-27 | Disposition: A | Discharge status: Home / Self Care | Payer: Medicaid Other | Source: Ambulatory Visit | Attending: Obstetrics and Gynecology | Admitting: Obstetrics and Gynecology

## 2015-07-27 VITALS — BP 111/70 | HR 78 | Wt 148.6 lb

## 2015-07-27 DIAGNOSIS — Z113 Encounter for screening for infections with a predominantly sexual mode of transmission: Secondary | ICD-10-CM | POA: Diagnosis not present

## 2015-07-27 DIAGNOSIS — O0933 Supervision of pregnancy with insufficient antenatal care, third trimester: Secondary | ICD-10-CM | POA: Diagnosis not present

## 2015-07-27 LAB — POCT URINALYSIS DIP (DEVICE)
Bilirubin Urine: NEGATIVE
Glucose, UA: NEGATIVE mg/dL
Hgb urine dipstick: NEGATIVE
Ketones, ur: NEGATIVE mg/dL
Nitrite: NEGATIVE
Protein, ur: NEGATIVE mg/dL
Specific Gravity, Urine: 1.015 (ref 1.005–1.030)
Urobilinogen, UA: 0.2 mg/dL (ref 0.0–1.0)
pH: 7 (ref 5.0–8.0)

## 2015-07-27 LAB — OB RESULTS CONSOLE GBS: GBS: NEGATIVE

## 2015-07-27 LAB — OB RESULTS CONSOLE GC/CHLAMYDIA: Gonorrhea: NEGATIVE

## 2015-07-27 NOTE — Progress Notes (Signed)
Subjective:  Kathleen Cooke is a 20 y.o. G1P0 at 7097w3d being seen today for ongoing prenatal care.  She is currently monitored for the following issues for this low-risk pregnancy and has Late prenatal care affecting pregnancy in third trimester on her problem list.  Patient reports no complaints.  Contractions: Not present.Feels pelvic pressure.  Vag. Bleeding: None.  Movement: Present. Denies leaking of fluid.   The following portions of the patient's history were reviewed and updated as appropriate: allergies, current medications, past family history, past medical history, past social history, past surgical history and problem list. Problem list updated.  Objective:   Filed Vitals:   07/27/15 0950  BP: 111/70  Pulse: 78  Weight: 148 lb 9.6 oz (67.405 kg)    Fetal Status: Fetal Heart Rate (bpm): 154   Movement: Present     General:  Alert, oriented and cooperative. Patient is in no acute distress.  Skin: Skin is warm and dry. No rash noted.   Cardiovascular: Normal heart rate noted  Respiratory: Normal respiratory effort, no problems with respiration noted  Abdomen: Soft, gravid, appropriate for gestational age. Pain/Pressure: Present     Pelvic: Vag. Bleeding: None     Cervical exam performed      c/50/-1 cephalic  Extremities: Normal range of motion.  Edema: None  Mental Status: Normal mood and affect. Normal behavior. Normal judgment and thought content.   Urinalysis: Urine Protein: Negative Urine Glucose: Negative  Assessment and Plan:  Pregnancy: G1P0 at 2397w3d  1. Late prenatal care affecting pregnancy in third trimester Doing well - Culture, beta strep (group b only) - GC/Chlamydia probe amp ([place]  Term labor symptoms and general obstetric precautions including but not limited to vaginal bleeding, contractions, leaking of fluid and fetal movement were reviewed in detail with the patient. Please refer to After Visit Summary for other counseling  recommendations.  Return in about 1 week (around 08/03/2015).   Danae Orleanseirdre C Lanita Stammen, CNM

## 2015-07-27 NOTE — Progress Notes (Signed)
Educated pt on Rooming In  

## 2015-07-27 NOTE — Patient Instructions (Signed)

## 2015-07-28 LAB — GC/CHLAMYDIA PROBE AMP (~~LOC~~) NOT AT ARMC
Chlamydia: NEGATIVE
Neisseria Gonorrhea: NEGATIVE

## 2015-07-29 LAB — CULTURE, BETA STREP (GROUP B ONLY)

## 2015-08-03 ENCOUNTER — Ambulatory Visit (INDEPENDENT_AMBULATORY_CARE_PROVIDER_SITE_OTHER): Payer: Medicaid Other | Admitting: Family

## 2015-08-03 VITALS — BP 107/67 | HR 68 | Wt 148.0 lb

## 2015-08-03 DIAGNOSIS — O0933 Supervision of pregnancy with insufficient antenatal care, third trimester: Secondary | ICD-10-CM

## 2015-08-03 LAB — POCT URINALYSIS DIP (DEVICE)
Bilirubin Urine: NEGATIVE
Glucose, UA: NEGATIVE mg/dL
Ketones, ur: NEGATIVE mg/dL
Nitrite: NEGATIVE
Protein, ur: NEGATIVE mg/dL
Specific Gravity, Urine: 1.015 (ref 1.005–1.030)
Urobilinogen, UA: 0.2 mg/dL (ref 0.0–1.0)
pH: 6.5 (ref 5.0–8.0)

## 2015-08-03 NOTE — Progress Notes (Signed)
Subjective:  Kathleen Cooke is a 20 y.o. G1P0 at 10029w3d being seen today for ongoing prenatal care.  She is currently monitored for the following issues for this low-risk pregnancy and has Late prenatal care affecting pregnancy in third trimester on her problem list.  Patient reports no complaints.  Contractions: Not present. Vag. Bleeding: None.  Movement: Present. Denies leaking of fluid.   The following portions of the patient's history were reviewed and updated as appropriate: allergies, current medications, past family history, past medical history, past social history, past surgical history and problem list. Problem list updated.  Objective:   Filed Vitals:   08/03/15 1121  BP: 107/67  Pulse: 68  Weight: 148 lb (67.132 kg)    Fetal Status: Fetal Heart Rate (bpm): 154 Fundal Height: 36 cm Movement: Present     General:  Alert, oriented and cooperative. Patient is in no acute distress.  Skin: Skin is warm and dry. No rash noted.   Cardiovascular: Normal heart rate noted  Respiratory: Normal respiratory effort, no problems with respiration noted  Abdomen: Soft, gravid, appropriate for gestational age. Pain/Pressure: Present     Pelvic: Vag. Bleeding: None     Cervical exam deferred        Extremities: Normal range of motion.     Mental Status: Normal mood and affect. Normal behavior. Normal judgment and thought content.   Urinalysis:    Protein neg Glucose neg  Assessment and Plan:  Pregnancy: G1P0 at 9029w3d  1. Late prenatal care affecting pregnancy in third trimester - Reviewed GBS neg  Term labor symptoms and general obstetric precautions including but not limited to vaginal bleeding, contractions, leaking of fluid and fetal movement were reviewed in detail with the patient. Please refer to After Visit Summary for other counseling recommendations.  Return in about 1 week (around 08/10/2015).   Eino FarberWalidah Kennith GainN Karim, CNM

## 2015-08-10 ENCOUNTER — Ambulatory Visit (INDEPENDENT_AMBULATORY_CARE_PROVIDER_SITE_OTHER): Payer: Medicaid Other | Admitting: Advanced Practice Midwife

## 2015-08-10 VITALS — BP 108/63 | HR 82 | Wt 154.1 lb

## 2015-08-10 DIAGNOSIS — O0933 Supervision of pregnancy with insufficient antenatal care, third trimester: Secondary | ICD-10-CM | POA: Diagnosis not present

## 2015-08-10 LAB — POCT URINALYSIS DIP (DEVICE)
Bilirubin Urine: NEGATIVE
Glucose, UA: NEGATIVE mg/dL
Hgb urine dipstick: NEGATIVE
Ketones, ur: NEGATIVE mg/dL
Nitrite: NEGATIVE
Protein, ur: NEGATIVE mg/dL
Specific Gravity, Urine: 1.01 (ref 1.005–1.030)
Urobilinogen, UA: 0.2 mg/dL (ref 0.0–1.0)
pH: 6 (ref 5.0–8.0)

## 2015-08-10 NOTE — Progress Notes (Signed)
Subjective:  Kathleen Cooke is a 20 y.o. G1P0 at 4944w3d being seen today for ongoing prenatal care.  She is currently monitored for the following issues for this low-risk pregnancy and has Late prenatal care affecting pregnancy in third trimester on her problem list.  Patient reports no complaints.  Contractions: Not present. Vag. Bleeding: None.  Movement: Present. Denies leaking of fluid.   The following portions of the patient's history were reviewed and updated as appropriate: allergies, current medications, past family history, past medical history, past social history, past surgical history and problem list. Problem list updated.  Objective:   Filed Vitals:   08/10/15 0919  BP: 108/63  Pulse: 82  Weight: 154 lb 1.6 oz (69.899 kg)    Fetal Status: Fetal Heart Rate (bpm): 140 Fundal Height: 38 cm Movement: Present     General:  Alert, oriented and cooperative. Patient is in no acute distress.  Skin: Skin is warm and dry. No rash noted.   Cardiovascular: Normal heart rate noted  Respiratory: Normal respiratory effort, no problems with respiration noted  Abdomen: Soft, gravid, appropriate for gestational age. Pain/Pressure: Absent     Pelvic: Vag. Bleeding: None     Cervical exam deferred        Extremities: Normal range of motion.  Edema: None  Mental Status: Normal mood and affect. Normal behavior. Normal judgment and thought content.   Urinalysis:      Assessment and Plan:  Pregnancy: G1P0 at 1244w3d  1. Late prenatal care affecting pregnancy in third trimester   Term labor symptoms and general obstetric precautions including but not limited to vaginal bleeding, contractions, leaking of fluid and fetal movement were reviewed in detail with the patient. Please refer to After Visit Summary for other counseling recommendations.  Return in about 1 week (around 08/17/2015).   Hurshel PartyLisa A Leftwich-Kirby, CNM

## 2015-08-17 ENCOUNTER — Ambulatory Visit (INDEPENDENT_AMBULATORY_CARE_PROVIDER_SITE_OTHER): Payer: Medicaid Other | Admitting: Student

## 2015-08-17 VITALS — BP 115/65 | HR 69 | Wt 150.0 lb

## 2015-08-17 DIAGNOSIS — O0933 Supervision of pregnancy with insufficient antenatal care, third trimester: Secondary | ICD-10-CM

## 2015-08-17 LAB — POCT URINALYSIS DIP (DEVICE)
Glucose, UA: NEGATIVE mg/dL
Hgb urine dipstick: NEGATIVE
Ketones, ur: NEGATIVE mg/dL
Nitrite: NEGATIVE
Protein, ur: NEGATIVE mg/dL
Specific Gravity, Urine: 1.02 (ref 1.005–1.030)
Urobilinogen, UA: 1 mg/dL (ref 0.0–1.0)
pH: 6.5 (ref 5.0–8.0)

## 2015-08-17 NOTE — Patient Instructions (Signed)
Braxton Hicks Contractions °Contractions of the uterus can occur throughout pregnancy. Contractions are not always a sign that you are in labor.  °WHAT ARE BRAXTON HICKS CONTRACTIONS?  °Contractions that occur before labor are called Braxton Hicks contractions, or false labor. Toward the end of pregnancy (32-34 weeks), these contractions can develop more often and may become more forceful. This is not true labor because these contractions do not result in opening (dilatation) and thinning of the cervix. They are sometimes difficult to tell apart from true labor because these contractions can be forceful and people have different pain tolerances. You should not feel embarrassed if you go to the hospital with false labor. Sometimes, the only way to tell if you are in true labor is for your health care provider to look for changes in the cervix. °If there are no prenatal problems or other health problems associated with the pregnancy, it is completely safe to be sent home with false labor and await the onset of true labor. °HOW CAN YOU TELL THE DIFFERENCE BETWEEN TRUE AND FALSE LABOR? °False Labor °· The contractions of false labor are usually shorter and not as hard as those of true labor.   °· The contractions are usually irregular.   °· The contractions are often felt in the front of the lower abdomen and in the groin.   °· The contractions may go away when you walk around or change positions while lying down.   °· The contractions get weaker and are shorter lasting as time goes on.   °· The contractions do not usually become progressively stronger, regular, and closer together as with true labor.   °True Labor °· Contractions in true labor last 30-70 seconds, become very regular, usually become more intense, and increase in frequency.   °· The contractions do not go away with walking.   °· The discomfort is usually felt in the top of the uterus and spreads to the lower abdomen and low back.   °· True labor can be  determined by your health care provider with an exam. This will show that the cervix is dilating and getting thinner.   °WHAT TO REMEMBER °· Keep up with your usual exercises and follow other instructions given by your health care provider.   °· Take medicines as directed by your health care provider.   °· Keep your regular prenatal appointments.   °· Eat and drink lightly if you think you are going into labor.   °· If Braxton Hicks contractions are making you uncomfortable:   °¨ Change your position from lying down or resting to walking, or from walking to resting.   °¨ Sit and rest in a tub of warm water.   °¨ Drink 2-3 glasses of water. Dehydration may cause these contractions.   °¨ Do slow and deep breathing several times an hour.   °WHEN SHOULD I SEEK IMMEDIATE MEDICAL CARE? °Seek immediate medical care if: °· Your contractions become stronger, more regular, and closer together.   °· You have fluid leaking or gushing from your vagina.   °· You have a fever.   °· You pass blood-tinged mucus.   °· You have vaginal bleeding.   °· You have continuous abdominal pain.   °· You have low back pain that you never had before.   °· You feel your baby's head pushing down and causing pelvic pressure.   °· Your baby is not moving as much as it used to.   °  °This information is not intended to replace advice given to you by your health care provider. Make sure you discuss any questions you have with your health care   provider. °  °Document Released: 03/20/2005 Document Revised: 03/25/2013 Document Reviewed: 12/30/2012 °Elsevier Interactive Patient Education ©2016 [employer] ° °

## 2015-08-18 NOTE — Progress Notes (Signed)
Subjective:  Kathleen Cooke is a 20 y.o. G1P0 at 7363w4d being seen today for ongoing prenatal care.  She is currently monitored for the following issues for this low-risk pregnancy and has Late prenatal care affecting pregnancy in third trimester on her problem list.  Patient reports no complaints.  Contractions: Not present. Vag. Bleeding: None.  Movement: Present. Denies leaking of fluid.   The following portions of the patient's history were reviewed and updated as appropriate: allergies, current medications, past family history, past medical history, past social history, past surgical history and problem list. Problem list updated.  Objective:   Filed Vitals:   08/17/15 1404  BP: 115/65  Pulse: 69  Weight: 150 lb (68.04 kg)    Fetal Status: Fetal Heart Rate (bpm): 144 Fundal Height: 37 cm Movement: Present  Presentation: Vertex  General:  Alert, oriented and cooperative. Patient is in no acute distress.  Skin: Skin is warm and dry. No rash noted.   Cardiovascular: Normal heart rate noted  Respiratory: Normal respiratory effort, no problems with respiration noted  Abdomen: Soft, gravid, appropriate for gestational age. Pain/Pressure: Present     Pelvic: Vag. Bleeding: None     Cervical exam performed Dilation: 1.5 Effacement (%): 60    Extremities: Normal range of motion.  Edema: None  Mental Status: Normal mood and affect. Normal behavior. Normal judgment and thought content.   Urinalysis:      Assessment and Plan:  Pregnancy: G1P0 at 5063w4d  1. Late prenatal care affecting pregnancy in third trimester   Term labor symptoms and general obstetric precautions including but not limited to vaginal bleeding, contractions, leaking of fluid and fetal movement were reviewed in detail with the patient. Please refer to After Visit Summary for other counseling recommendations.  Return in about 1 week (around 08/24/2015) for Routine OB.   Judeth HornErin Dae Antonucci, NP

## 2015-08-21 ENCOUNTER — Inpatient Hospital Stay (HOSPITAL_COMMUNITY): Payer: Medicaid Other | Admitting: Anesthesiology

## 2015-08-21 ENCOUNTER — Inpatient Hospital Stay (HOSPITAL_COMMUNITY)
Admission: AD | Admit: 2015-08-21 | Discharge: 2015-08-23 | DRG: 775 | Disposition: A | Discharge status: Home / Self Care | Payer: Medicaid Other | Source: Ambulatory Visit | Attending: Obstetrics & Gynecology | Admitting: Obstetrics & Gynecology

## 2015-08-21 ENCOUNTER — Encounter (HOSPITAL_COMMUNITY): Payer: Self-pay | Admitting: Certified Nurse Midwife

## 2015-08-21 DIAGNOSIS — IMO0001 Reserved for inherently not codable concepts without codable children: Secondary | ICD-10-CM

## 2015-08-21 DIAGNOSIS — O9952 Diseases of the respiratory system complicating childbirth: Secondary | ICD-10-CM | POA: Diagnosis present

## 2015-08-21 DIAGNOSIS — J45909 Unspecified asthma, uncomplicated: Secondary | ICD-10-CM | POA: Diagnosis present

## 2015-08-21 DIAGNOSIS — Z3A40 40 weeks gestation of pregnancy: Secondary | ICD-10-CM

## 2015-08-21 DIAGNOSIS — O0933 Supervision of pregnancy with insufficient antenatal care, third trimester: Secondary | ICD-10-CM

## 2015-08-21 LAB — CBC
HCT: 37.9 % (ref 36.0–46.0)
Hemoglobin: 12.9 g/dL (ref 12.0–15.0)
MCH: 29.2 pg (ref 26.0–34.0)
MCHC: 34 g/dL (ref 30.0–36.0)
MCV: 85.7 fL (ref 78.0–100.0)
Platelets: 240 10*3/uL (ref 150–400)
RBC: 4.42 MIL/uL (ref 3.87–5.11)
RDW: 13.8 % (ref 11.5–15.5)
WBC: 14.5 10*3/uL — ABNORMAL HIGH (ref 4.0–10.5)

## 2015-08-21 LAB — TYPE AND SCREEN
ABO/RH(D): O POS
Antibody Screen: NEGATIVE

## 2015-08-21 MED ORDER — ONDANSETRON HCL 4 MG/2ML IJ SOLN: 4.0000 mg | Freq: Four times a day (QID) | INTRAMUSCULAR | Status: DC | PRN

## 2015-08-21 MED ORDER — LACTATED RINGERS IV SOLN: INTRAVENOUS | Status: DC

## 2015-08-21 MED ORDER — PHENYLEPHRINE 40 MCG/ML (10ML) SYRINGE FOR IV PUSH (FOR BLOOD PRESSURE SUPPORT)
80.0000 ug | PREFILLED_SYRINGE | INTRAVENOUS | Status: DC | PRN
  Filled 2015-08-21: qty 5

## 2015-08-21 MED ORDER — PHENYLEPHRINE 40 MCG/ML (10ML) SYRINGE FOR IV PUSH (FOR BLOOD PRESSURE SUPPORT)
80.0000 ug | PREFILLED_SYRINGE | INTRAVENOUS | Status: DC | PRN
  Filled 2015-08-21: qty 5
  Filled 2015-08-21: qty 10

## 2015-08-21 MED ORDER — LIDOCAINE HCL (PF) 1 % IJ SOLN
INTRAMUSCULAR | Status: DC | PRN
Start: 2015-08-21 — End: 2015-08-22
  Administered 2015-08-21 (×2): 4 mL via EPIDURAL

## 2015-08-21 MED ORDER — DIPHENHYDRAMINE HCL 50 MG/ML IJ SOLN: 12.5000 mg | INTRAMUSCULAR | Status: DC | PRN

## 2015-08-21 MED ORDER — OXYTOCIN BOLUS FROM INFUSION
500.0000 mL | INTRAVENOUS | Status: DC
  Administered 2015-08-22: 500 mL via INTRAVENOUS

## 2015-08-21 MED ORDER — ACETAMINOPHEN 325 MG PO TABS
650.0000 mg | ORAL_TABLET | ORAL | Status: DC | PRN
  Filled 2015-08-21: qty 2

## 2015-08-21 MED ORDER — EPHEDRINE 5 MG/ML INJ
10.0000 mg | INTRAVENOUS | Status: DC | PRN
  Filled 2015-08-21: qty 2

## 2015-08-21 MED ORDER — EPHEDRINE 5 MG/ML INJ
10.0000 mg | INTRAVENOUS | Status: DC | PRN
Start: 2015-08-21 — End: 2015-08-22
  Filled 2015-08-21: qty 2

## 2015-08-21 MED ORDER — LACTATED RINGERS IV SOLN: 500.0000 mL | INTRAVENOUS | Status: DC | PRN

## 2015-08-21 MED ORDER — LIDOCAINE HCL (PF) 1 % IJ SOLN
30.0000 mL | INTRAMUSCULAR | Status: DC | PRN
  Administered 2015-08-22: 30 mL via SUBCUTANEOUS
  Filled 2015-08-21 (×2): qty 30

## 2015-08-21 MED ORDER — LACTATED RINGERS IV SOLN: 500.0000 mL | Freq: Once | INTRAVENOUS | Status: DC

## 2015-08-21 MED ORDER — ALBUTEROL SULFATE (2.5 MG/3ML) 0.083% IN NEBU: 3.0000 mL | INHALATION_SOLUTION | Freq: Four times a day (QID) | RESPIRATORY_TRACT | Status: DC | PRN

## 2015-08-21 MED ORDER — FENTANYL CITRATE (PF) 100 MCG/2ML IJ SOLN
INTRAMUSCULAR | Status: AC
  Administered 2015-08-21: 100 ug via INTRAVENOUS
  Filled 2015-08-21: qty 2

## 2015-08-21 MED ORDER — FENTANYL 2.5 MCG/ML BUPIVACAINE 1/10 % EPIDURAL INFUSION (WH - ANES)
14.0000 mL/h | INTRAMUSCULAR | Status: DC | PRN
  Administered 2015-08-21: 14 mL/h via EPIDURAL
  Filled 2015-08-21: qty 125

## 2015-08-21 MED ORDER — LACTATED RINGERS IV SOLN
500.0000 mL | Freq: Once | INTRAVENOUS | Status: AC
  Administered 2015-08-21: 500 mL via INTRAVENOUS

## 2015-08-21 MED ORDER — OXYTOCIN 40 UNITS IN LACTATED RINGERS INFUSION - SIMPLE MED
2.5000 [IU]/h | INTRAVENOUS | Status: DC
Start: 2015-08-21 — End: 2015-08-22
  Filled 2015-08-21: qty 1000

## 2015-08-21 MED ORDER — FENTANYL CITRATE (PF) 100 MCG/2ML IJ SOLN
100.0000 ug | INTRAMUSCULAR | Status: DC | PRN
  Administered 2015-08-21 – 2015-08-22 (×2): 100 ug via INTRAVENOUS
  Filled 2015-08-21: qty 2

## 2015-08-21 MED ORDER — CITRIC ACID-SODIUM CITRATE 334-500 MG/5ML PO SOLN: 30.0000 mL | ORAL | Status: DC | PRN

## 2015-08-21 NOTE — H&P (Signed)
LABOR AND DELIVERY ADMISSION HISTORY AND PHYSICAL NOTE  Kathleen Cooke is a 20 y.o. female G1P0 with IUP at [redacted]w[redacted]d by 30 wk u/s presenting for contractions beginning this morning and Cooke at 7:30 this evening.   She reports positive fetal movement. She denies  vaginal bleeding.  Prenatal History/Complications:  Past Medical History: Past Medical History  Diagnosis Date  . Asthma   . Headache     Past Surgical History: Past Surgical History  Procedure Laterality Date  . No past surgeries      Obstetrical History: OB History    Gravida Para Term Preterm AB TAB SAB Ectopic Multiple Living   1 0        0      Social History: Social History   Social History  . Marital Status: Single    Spouse Name: N/A  . Number of Children: N/A  . Years of Education: N/A   Social History Main Topics  . Smoking status: Never Smoker   . Smokeless tobacco: Never Used  . Alcohol Use: No  . Drug Use: No  . Sexual Activity: Not Currently    Birth Control/ Protection: None   Other Topics Concern  . None   Social History Narrative    Family History: Family History  Problem Relation Age of Onset  . Birth defects Neg Hx   . Diabetes Neg Hx   . Early death Neg Hx   . Heart disease Neg Hx   . Hypertension Neg Hx   . Stroke Neg Hx   . Miscarriages / Stillbirths Neg Hx     Allergies: No Known Allergies  Prescriptions prior to admission  Medication Sig Dispense Refill Last Dose  . albuterol (PROVENTIL HFA;VENTOLIN HFA) 108 (90 Base) MCG/ACT inhaler Inhale 1-2 puffs into the lungs every 6 (six) hours as needed for wheezing or shortness of breath. (Patient not taking: Reported on 08/03/2015) 1 Inhaler 0 Not Taking  . Prenatal Vit-Fe Fumarate-FA (PRENATAL MULTIVITAMIN) TABS tablet Take 1 tablet by mouth daily at 12 noon.   Taking     Review of Systems   All systems reviewed and negative except as stated in HPI  Blood pressure 141/83, pulse 94, temperature 98 F (36.7 C),  temperature source Oral, resp. rate 18, height  (1.6 m), weight 150 lb (68.04 kg), last menstrual period 11/26/2014. General appearance: alert, cooperative, appears stated age and moderate distress Lungs: clear to auscultation bilaterally Heart: regular rate and rhythm Abdomen: soft, non-tender; bowel sounds normal Extremities: No calf swelling or tenderness Presentation: cephalic per rn exam Fetal monitoring: 140/mod/+a/-c Uterine activity: q 3 min  Dilation: 4 Effacement (%): 80 Station: -1 Exam by:: Dione Plover RN   Prenatal labs: ABO, Rh: O/POS/-- (03/14 1610) Antibody: NEG (03/14 0922) Rubella: !Error!imm RPR: NON REAC (03/14 0922)  HBsAg: NEGATIVE (03/14 9604)  HIV: NONREACTIVE (03/14 5409)  GBS: Negative (04/25 0000)  1 hr Glucola: 100 Genetic screening:  Too late Anatomy US: wnl  Prenatal Transfer Tool  Maternal Diabetes: No Genetic Screening: Declined Maternal Ultrasounds/Referrals: Normal Fetal Ultrasounds or other Referrals:  None Maternal Substance Abuse:  No Significant Maternal Medications:  None Significant Maternal Lab Results: Lab values include: Group B Strep negative  No results found for this or any previous visit (from the past 24 hour(s)).  Patient Active Problem List   Diagnosis Date Noted  . Active labor at term 08/21/2015  . Late prenatal care affecting pregnancy in third trimester 06/15/2015    Assessment: Kathleen Cooke  Kathleen Cooke  #Labor: expectant #Pain: epidural #FWB: Cat 1 #ID:  gbs neg #MOF: br #MOC: undecided #Circ:  undecided #Asthma: noted. Asymptomatic  Kathleen Bilisoah B Deavion Cooke 08/21/2015, 8:53 PM

## 2015-08-21 NOTE — Anesthesia Preprocedure Evaluation (Signed)
Anesthesia Evaluation  Patient identified by MRN, date of birth, ID band Patient awake    Reviewed: Allergy & Precautions, NPO status , Patient's Chart, lab work & pertinent test results  Airway Mallampati: I  TM Distance: >3 FB Neck ROM: Full    Dental  (+) Teeth Intact   Pulmonary asthma ,    breath sounds clear to auscultation       Cardiovascular negative cardio ROS   Rhythm:Regular     Neuro/Psych  Headaches, negative psych ROS   GI/Hepatic negative GI ROS, Neg liver ROS,   Endo/Other  negative endocrine ROS  Renal/GU negative Renal ROS  negative genitourinary   Musculoskeletal negative musculoskeletal ROS (+)   Abdominal   Peds negative pediatric ROS (+)  Hematology negative hematology ROS (+)   Anesthesia Other Findings   Reproductive/Obstetrics (+) Pregnancy                             Lab Results  Component Value Date   WBC 14.5* 08/21/2015   HGB 12.9 08/21/2015   HCT 37.9 08/21/2015   MCV 85.7 08/21/2015   PLT 240 08/21/2015   No results found for: INR, PROTIME   Anesthesia Physical Anesthesia Plan  ASA: II  Anesthesia Plan: Epidural   Post-op Pain Management:    Induction:   Airway Management Planned:   Additional Equipment:   Intra-op Plan:   Post-operative Plan:   Informed Consent: I have reviewed the patients History and Physical, chart, labs and discussed the procedure including the risks, benefits and alternatives for the proposed anesthesia with the patient or authorized representative who has indicated his/her understanding and acceptance.     Plan Discussed with:   Anesthesia Plan Comments:         Anesthesia Quick Evaluation

## 2015-08-21 NOTE — Anesthesia Procedure Notes (Signed)
Epidural Patient location during procedure: OB Start time: 08/21/2015 9:15 PM End time: 08/21/2015 9:30 PM  Staffing Anesthesiologist: Shona SimpsonHOLLIS, Lahna Nath D Performed by: anesthesiologist   Preanesthetic Checklist Completed: patient identified, site marked, surgical consent, pre-op evaluation, timeout performed, IV checked, risks and benefits discussed and monitors and equipment checked  Epidural Patient position: sitting Prep: ChloraPrep Patient monitoring: heart rate, continuous pulse ox and blood pressure Approach: midline Location: L3-L4 Injection technique: LOR saline  Needle:  Needle type: Tuohy  Needle gauge: 17 G Needle length: 9 cm Catheter type: closed end flexible Catheter size: 20 Guage Test dose: negative and 1.5% lidocaine  Assessment Events: blood not aspirated, injection not painful, no injection resistance and no paresthesia  Additional Notes LOR @ 5  Patient identified. Risks/Benefits/Options discussed with patient including but not limited to bleeding, infection, nerve damage, paralysis, failed block, incomplete pain control, headache, blood pressure changes, nausea, vomiting, reactions to medications, itching and postpartum back pain. Confirmed with bedside nurse the patient's most recent platelet count. Confirmed with patient that they are not currently taking any anticoagulation, have any bleeding history or any family history of bleeding disorders. Patient expressed understanding and wished to proceed. All questions were answered. Sterile technique was used throughout the entire procedure. Please see nursing notes for vital signs. Test dose was given through epidural catheter and negative prior to continuing to dose epidural or start infusion. Warning signs of high block given to the patient including shortness of breath, tingling/numbness in hands, complete motor block, or any concerning symptoms with instructions to call for help. Patient was given instructions on  fall risk and not to get out of bed. All questions and concerns addressed with instructions to call with any issues or inadequate analgesia.    Reason for block:procedure for pain

## 2015-08-21 NOTE — MAU Note (Signed)
Pt states her water broke and she is "hurting".

## 2015-08-22 ENCOUNTER — Encounter (HOSPITAL_COMMUNITY): Payer: Self-pay

## 2015-08-22 DIAGNOSIS — Z3A40 40 weeks gestation of pregnancy: Secondary | ICD-10-CM

## 2015-08-22 LAB — CBC
HCT: 32.9 % — ABNORMAL LOW (ref 36.0–46.0)
Hemoglobin: 11.2 g/dL — ABNORMAL LOW (ref 12.0–15.0)
MCH: 29.2 pg (ref 26.0–34.0)
MCHC: 34 g/dL (ref 30.0–36.0)
MCV: 85.9 fL (ref 78.0–100.0)
Platelets: 199 10*3/uL (ref 150–400)
RBC: 3.83 MIL/uL — ABNORMAL LOW (ref 3.87–5.11)
RDW: 14.1 % (ref 11.5–15.5)
WBC: 18.6 10*3/uL — ABNORMAL HIGH (ref 4.0–10.5)

## 2015-08-22 LAB — ABO/RH: ABO/RH(D): O POS

## 2015-08-22 LAB — RPR: RPR Ser Ql: NONREACTIVE

## 2015-08-22 MED ORDER — ACETAMINOPHEN 325 MG PO TABS
650.0000 mg | ORAL_TABLET | ORAL | Status: DC | PRN
  Administered 2015-08-22: 650 mg via ORAL
  Filled 2015-08-22: qty 2

## 2015-08-22 MED ORDER — METHYLERGONOVINE MALEATE 0.2 MG/ML IJ SOLN
INTRAMUSCULAR | Status: AC
  Filled 2015-08-22: qty 1

## 2015-08-22 MED ORDER — ONDANSETRON HCL 4 MG/2ML IJ SOLN: 4.0000 mg | INTRAMUSCULAR | Status: DC | PRN

## 2015-08-22 MED ORDER — METHYLERGONOVINE MALEATE 0.2 MG PO TABS
0.2000 mg | ORAL_TABLET | Freq: Four times a day (QID) | ORAL | Status: AC
  Administered 2015-08-22 (×3): 0.2 mg via ORAL
  Filled 2015-08-22 (×3): qty 1

## 2015-08-22 MED ORDER — ZOLPIDEM TARTRATE 5 MG PO TABS: 5.0000 mg | ORAL_TABLET | Freq: Every evening | ORAL | Status: DC | PRN

## 2015-08-22 MED ORDER — SIMETHICONE 80 MG PO CHEW: 80.0000 mg | CHEWABLE_TABLET | ORAL | Status: DC | PRN

## 2015-08-22 MED ORDER — OXYTOCIN 40 UNITS IN LACTATED RINGERS INFUSION - SIMPLE MED
1.0000 m[IU]/min | INTRAVENOUS | Status: DC
  Administered 2015-08-22: 2 m[IU]/min via INTRAVENOUS

## 2015-08-22 MED ORDER — WITCH HAZEL-GLYCERIN EX PADS: 1.0000 "application " | MEDICATED_PAD | CUTANEOUS | Status: DC | PRN

## 2015-08-22 MED ORDER — CARBOPROST TROMETHAMINE 250 MCG/ML IM SOLN
250.0000 ug | Freq: Once | INTRAMUSCULAR | Status: AC
  Administered 2015-08-22: 250 ug via INTRAMUSCULAR

## 2015-08-22 MED ORDER — TETANUS-DIPHTH-ACELL PERTUSSIS 5-2.5-18.5 LF-MCG/0.5 IM SUSP: 0.5000 mL | Freq: Once | INTRAMUSCULAR | Status: DC

## 2015-08-22 MED ORDER — MISOPROSTOL 200 MCG PO TABS
ORAL_TABLET | ORAL | Status: AC
  Filled 2015-08-22: qty 4

## 2015-08-22 MED ORDER — METHYLERGONOVINE MALEATE 0.2 MG/ML IJ SOLN
0.2000 mg | Freq: Once | INTRAMUSCULAR | Status: AC
  Administered 2015-08-22: 0.2 mg via INTRAMUSCULAR

## 2015-08-22 MED ORDER — SENNOSIDES-DOCUSATE SODIUM 8.6-50 MG PO TABS
2.0000 | ORAL_TABLET | ORAL | Status: DC
  Filled 2015-08-22: qty 2

## 2015-08-22 MED ORDER — TERBUTALINE SULFATE 1 MG/ML IJ SOLN
0.2500 mg | Freq: Once | INTRAMUSCULAR | Status: DC | PRN
  Filled 2015-08-22: qty 1

## 2015-08-22 MED ORDER — IBUPROFEN 600 MG PO TABS
600.0000 mg | ORAL_TABLET | Freq: Four times a day (QID) | ORAL | Status: DC
  Administered 2015-08-22 – 2015-08-23 (×4): 600 mg via ORAL
  Filled 2015-08-22 (×4): qty 1

## 2015-08-22 MED ORDER — LOPERAMIDE HCL 2 MG PO CAPS
4.0000 mg | ORAL_CAPSULE | Freq: Four times a day (QID) | ORAL | Status: DC
  Administered 2015-08-22: 4 mg via ORAL
  Filled 2015-08-22 (×6): qty 2

## 2015-08-22 MED ORDER — MEASLES, MUMPS & RUBELLA VAC ~~LOC~~ INJ
0.5000 mL | INJECTION | Freq: Once | SUBCUTANEOUS | Status: DC
  Filled 2015-08-22: qty 0.5

## 2015-08-22 MED ORDER — DIPHENHYDRAMINE HCL 25 MG PO CAPS: 25.0000 mg | ORAL_CAPSULE | Freq: Four times a day (QID) | ORAL | Status: DC | PRN

## 2015-08-22 MED ORDER — BENZOCAINE-MENTHOL 20-0.5 % EX AERO
1.0000 "application " | INHALATION_SPRAY | CUTANEOUS | Status: DC | PRN
  Administered 2015-08-22: 1 via TOPICAL
  Filled 2015-08-22: qty 56

## 2015-08-22 MED ORDER — MISOPROSTOL 200 MCG PO TABS
800.0000 ug | ORAL_TABLET | Freq: Once | ORAL | Status: AC
  Administered 2015-08-22: 800 ug via BUCCAL

## 2015-08-22 MED ORDER — LOPERAMIDE HCL 2 MG PO CAPS
4.0000 mg | ORAL_CAPSULE | Freq: Four times a day (QID) | ORAL | Status: DC | PRN
  Administered 2015-08-22: 4 mg via ORAL
  Filled 2015-08-22 (×3): qty 2

## 2015-08-22 MED ORDER — ONDANSETRON HCL 4 MG PO TABS: 4.0000 mg | ORAL_TABLET | ORAL | Status: DC | PRN

## 2015-08-22 MED ORDER — PRENATAL MULTIVITAMIN CH
1.0000 | ORAL_TABLET | Freq: Every day | ORAL | Status: DC
  Administered 2015-08-22: 1 via ORAL
  Filled 2015-08-22: qty 1

## 2015-08-22 MED ORDER — CEFAZOLIN SODIUM-DEXTROSE 2-4 GM/100ML-% IV SOLN
2.0000 g | Freq: Once | INTRAVENOUS | Status: AC
  Administered 2015-08-22: 2 g via INTRAVENOUS
  Filled 2015-08-22: qty 100

## 2015-08-22 MED ORDER — DIBUCAINE 1 % RE OINT: 1.0000 "application " | TOPICAL_OINTMENT | RECTAL | Status: DC | PRN

## 2015-08-22 MED ORDER — COCONUT OIL OIL: 1.0000 "application " | TOPICAL_OIL | Status: DC | PRN

## 2015-08-22 NOTE — Lactation Note (Signed)
This note was copied from a baby's chart. Lactation Consultation Note  P1, Baby 7 hours old and mother states baby breastfed recently for an hour. Reviewed hand expression and mother expressed drops of colostrum. Reviewed basics including cluster feeding and supply & demand. Mom encouraged to feed baby 8-12 times/24 hours and with feeding cues.  Mom made aware of O/P services, breastfeeding support groups, community resources, and our phone # for post-discharge questions.  Suggest mother call for assistance with next feeding.  Patient Name: Kathleen Morrison Oldlliana Bastedo RUEAV'WToday's Date: 08/22/2015 Reason for consult: Initial assessment   Maternal Data Has patient been taught Hand Expression?: Yes Does the patient have breastfeeding experience prior to this delivery?: No  Feeding Feeding Type: Breast Fed Length of feed: 60 min  LATCH Score/Interventions                      Lactation Tools Discussed/Used     Consult Status Consult Status: Follow-up Date: 08/23/15 Follow-up type: In-patient    Kathleen Cooke, Kathleen Cooke 08/22/2015, 12:57 PM

## 2015-08-22 NOTE — Progress Notes (Signed)
Kathleen Cooke is a 20 y.o. G1P0 at 4057w1d admitted for contractions and ROM.  Subjective: Comfortable with epidural.  Objective: BP 119/73 mmHg  Pulse 83  Temp(Src) 98.6 F (37 C) (Oral)  Resp 18  Ht 5\' 3"  (1.6 m)  Wt 68.04 kg (150 lb)  BMI 26.58 kg/m2  SpO2 100%  LMP 11/26/2014 (Approximate)      FHT:  FHR: 140 bpm, variability: moderate,  accelerations:  Present,  decelerations:  Absent UC:   Regular SVE:   Dilation: Lip/rim Effacement (%): 90 Station: 0 Exam by:: e. poore, rnc  Labs: Lab Results  Component Value Date   WBC 14.5* 08/21/2015   HGB 12.9 08/21/2015   HCT 37.9 08/21/2015   MCV 85.7 08/21/2015   PLT 240 08/21/2015    Assessment / Plan: Spontaneous labor, progressing normally  Labor: Progressing normally Preeclampsia:  no signs or symptoms of toxicity Fetal Wellbeing:  Category I Pain Control:  Epidural I/D:  n/a Anticipated MOD:  NSVD  [place] Wanette Robison 08/22/2015, 3:10 AM

## 2015-08-23 ENCOUNTER — Ambulatory Visit: Payer: Self-pay

## 2015-08-23 MED ORDER — IBUPROFEN 600 MG PO TABS: 600.0000 mg | ORAL_TABLET | Freq: Four times a day (QID) | ORAL | Status: DC

## 2015-08-23 NOTE — Anesthesia Postprocedure Evaluation (Signed)
Anesthesia Post Note  Patient: Kathleen Cooke  Procedure(s) Performed: * No procedures listed *  Patient location during evaluation: Mother Baby Anesthesia Type: Epidural Level of consciousness: oriented and awake and alert Pain management: pain level controlled Vital Signs Assessment: post-procedure vital signs reviewed and stable Respiratory status: spontaneous breathing and nonlabored ventilation Cardiovascular status: stable Postop Assessment: epidural receding, patient able to bend at knees, no signs of nausea or vomiting and adequate PO intake Anesthetic complications: no     Last Vitals:  Filed Vitals:   08/22/15 1856 08/23/15 0517  BP: 92/66 105/58  Pulse: 78 84  Temp: 36.8 C 36.7 C  Resp:  20    Last Pain:  Filed Vitals:   08/23/15 0750  PainSc: 0-No pain   Pain Goal: Patients Stated Pain Goal: 4 (08/21/15 2046)               Laban EmperorMalinova,Jeroline Wolbert Hristova

## 2015-08-23 NOTE — Discharge Summary (Signed)
OB Discharge Summary  Patient Name: Kathleen Cooke DOB: 08-23-1995 MRN: [id number]  Date of admission: 08/21/2015 Delivering MD: Araceli Bouche   Date of discharge: 08/23/2015  Admitting diagnosis: 39w water broke Intrauterine pregnancy: [redacted]w[redacted]d     Secondary diagnosis:Active Problems:   Late prenatal care affecting pregnancy in third trimester   Active labor at term  Additional problems:none     Discharge diagnosis: Term Pregnancy Delivered                                                                     Post partum procedures:none  Augmentation: Pitocin  Complications: None  Hospital course:  Onset of Labor With Vaginal Delivery     20 y.o. yo G1P1001 at [redacted]w[redacted]d was admitted in Active Labor on 08/21/2015. Patient had an uncomplicated labor course as follows:  Membrane Rupture Time/Date: 7:30 PM ,08/21/2015   Intrapartum Procedures: Episiotomy: None [1]                                         Lacerations:     Patient had a delivery of a Viable infant. 08/22/2015  Information for the patient's newborn:  [place], [place] [[id number]]  Delivery Method: Vaginal, Spontaneous Delivery (Filed from Delivery Summary)    Pateint had an uncomplicated postpartum course.  She is ambulating, tolerating a regular diet, passing flatus, and urinating well. Patient is discharged home in stable condition on 08/23/2015.    Physical exam  Filed Vitals:   08/22/15 1015 08/22/15 1418 08/22/15 1856 08/23/15 0517  BP: 104/84 98/82 92/66  105/58  Pulse: 96 96 78 84  Temp: 98.9 F (37.2 C) 98.5 F (36.9 C) 98.3 F (36.8 C) 98.1 F (36.7 C)  TempSrc: Oral Oral Oral Oral  Resp: Height:      Weight:      SpO2: 100% 100% 98%    General: alert, cooperative and no distress Lochia: appropriate Uterine Fundus: firm Incision: N/A DVT Evaluation: Negative Homan's sign. No cords or calf tenderness. No significant calf/ankle edema. Labs: Lab Results  Component Value  Date   WBC 18.6* 08/22/2015   HGB 11.2* 08/22/2015   HCT 32.9* 08/22/2015   MCV 85.9 08/22/2015   PLT 199 08/22/2015   No flowsheet data found.  Discharge instruction: per After Visit Summary and "Baby and Me Booklet".  After Visit Meds:    Medication List    ASK your doctor about these medications        albuterol 108 (90 Base) MCG/ACT inhaler  Commonly known as:  PROVENTIL HFA;VENTOLIN HFA  Inhale 1-2 puffs into the lungs every 6 (six) hours as needed for wheezing or shortness of breath.     prenatal multivitamin Tabs tablet  Take 1 tablet by mouth daily at 12 noon.        Diet: routine diet  Activity: Advance as tolerated. Pelvic rest for 6 weeks.   Outpatient follow up:6 weeks Follow up Appt:Future Appointments Date Time Provider Department Center  08/24/2015 10:00 AM Marny Lowenstein, PA-C [facility]   Follow up visit: No Follow-up on file.  Postpartum contraception:  Undecided  Newborn Data: Live born female  Birth Weight: 7 lb 7.9 oz (3400 g) APGAR: 9, 9  Baby Feeding: Breast Disposition:home with mother   08/23/2015 Kathleen Cooke, CNM

## 2015-08-23 NOTE — Lactation Note (Signed)
This note was copied from a baby's chart. Lactation Consultation Note  Patient Name: Kathleen Cooke UJWJX'BToday's Date: 08/23/2015 Reason for consult: Follow-up assessment Baby at 36 hr of life. Mom is reporting "very" sore nipples with latch and pumping. Baby does have a recessed chin, high palate, and thick tight upper labial frenulum. Baby will suck on a gloved finger with nice peristolic tongue movement. Mom reports baby is biting but that was not observed at this time. Mom reports the surface of the nipple is bruised, cracked, and bleeding but this was not seen at this time. The nipple surface looks like normal rippled skin. Given comfort gels. MGM is in the room and repeatedly said mom does not have any milk. She is getting angry that baby is being offered formula. Colostrum is easily expressed bilaterally. Mom stated she wants to bf. Discussed baby behavior, feeding frequency, supplementing, pumping, baby belly size, voids, wt loss, breast changes, and nipple care. Mom is aware of lactation services and support group. Left baby sts, encouraged mom to offer the breast 8+/24hr, pump as needed, and call for bf support.       Maternal Data    Feeding Feeding Type: Breast Fed Length of feed: 0 min  LATCH Score/Interventions Latch: Too sleepy or reluctant, no latch achieved, no sucking elicited. Intervention(s): Skin to skin;Teach feeding cues;Waking techniques Intervention(s): Adjust position;Assist with latch;Breast compression  Audible Swallowing: None Intervention(s): Hand expression;Skin to skin Intervention(s): Alternate breast massage  Type of Nipple: Everted at rest and after stimulation  Comfort (Breast/Nipple): Filling, red/small blisters or bruises, mild/mod discomfort Problem noted: Cracked, bleeding, blisters, bruises Intervention(s): Expressed breast milk to nipple  Problem noted: Severe discomfort  Hold (Positioning): Full assist, staff holds infant at  breast Intervention(s): Support Pillows;Position options  LATCH Score: 3  Lactation Tools Discussed/Used     Consult Status Consult Status: Follow-up Date: 08/24/15 Follow-up type: In-patient    Kathleen Cooke 08/23/2015, 5:51 PM

## 2015-08-24 ENCOUNTER — Encounter: Payer: Medicaid Other | Admitting: Medical

## 2015-08-24 ENCOUNTER — Ambulatory Visit: Payer: Self-pay

## 2015-08-24 NOTE — Lactation Note (Signed)
This note was copied from a baby's chart. Lactation Consultation Note:  infant is 7152 hours old and is at 8% weight loss this am.   Mother states that infant is still bitting when she trys to latch.  Mother states that nipples are still sore. Observed that  Mother has bilateral positional strips.  Discussed allowing her nipples to heal for several more feedings. Advised mother to pre pump with harmony hand pump before latching.  She states that she is pumping and giving EBM/formula with a bottle.  Mother pumped 10 ml of ebm at 6am. She gave infant 26 ml of formula at 7am. Mother advised to page for assistance with next feeding.  I will observe latch. Engorgement treatment reviewed in baby and me book. Mother is aware of available Lactation services   Patient Name: [name] ZOXWR'UToday's Date: 08/24/2015 Reason for consult: Follow-up assessment   Maternal Data    Feeding Feeding Type: Formula Nipple Type: Slow - flow  LATCH Score/Interventions Latch:  (instructed mother to call for next latch)                    Lactation Tools Discussed/Used     Consult Status Consult Status: Follow-up Date: 08/24/15 Follow-up type: In-patient    [name], [name] 08/24/2015, 9:59 AM

## 2015-10-28 ENCOUNTER — Encounter: Payer: Self-pay | Admitting: Medical

## 2015-10-28 ENCOUNTER — Ambulatory Visit (INDEPENDENT_AMBULATORY_CARE_PROVIDER_SITE_OTHER): Payer: Medicaid Other | Admitting: Medical

## 2015-10-28 MED ORDER — PRENATAL VITAMINS PLUS 27-1 MG PO TABS: 1.0000 | ORAL_TABLET | Freq: Once | ORAL | 2 refills | Status: AC

## 2015-10-28 MED ORDER — PRENATAL MULTIVITAMIN CH: 1.0000 | ORAL_TABLET | Freq: Every day | ORAL | 6 refills | Status: DC

## 2015-10-28 NOTE — Addendum Note (Signed)
Addended by: Cheree Ditto, DEMETRICE A on: 10/28/2015 05:02 PM   Modules accepted: Orders

## 2015-10-28 NOTE — Patient Instructions (Signed)

## 2015-10-28 NOTE — Progress Notes (Signed)
Subjective:     Kathleen Cooke is a 20 y.o. female who presents for a postpartum visit. She is 9 weeks postpartum following a spontaneous vaginal delivery. I have fully reviewed the prenatal and intrapartum course. The delivery was at 39 gestational weeks. Outcome: spontaneous vaginal delivery. Anesthesia: epidural. Postpartum course has been unremarkable. Baby's course has been unremarkable. Baby is feeding by both breast and bottle - Similac Advance. Bleeding no bleeding. Bowel function is normal. Bladder function is normal. Patient is sexually active. Contraception method is none. Postpartum depression screening: negative.  The following portions of the patient's history were reviewed and updated as appropriate: allergies, current medications, past family history, past medical history, past social history, past surgical history and problem list.  Review of Systems Pertinent items are noted in HPI.   Objective:    BP 106/75 (BP Location: Left Arm, Patient Position: Sitting, Cuff Size: Normal)   Pulse 61   Ht 5\' 2"  (1.575 m)   Wt 126 lb 6.4 oz (57.3 kg)   LMP 10/20/2015 (Exact Date)   Breastfeeding? Yes   BMI 23.12 kg/m   General:  alert and cooperative   Breasts:  not performed  Lungs: clear to auscultation bilaterally  Heart:  regular rate and rhythm, S1, S2 normal, no murmur, click, rub or gallop  Abdomen: soft, non-tender; bowel sounds normal; no masses,  no organomegaly   Vulva:  not evaluated  Vagina: not evaluated  Cervix:  not evaluated  Corpus: not examined  Adnexa:  not evaluated  Rectal Exam: Not performed.        Assessment:     Normal postpartum exam. Pap smear not done at today's visit. Pap due at age 76 years.   Plan:    1. Contraception: condoms 2. Patient desires Depo Provera, will return in 1 week for UPT, if negative then can initiate Depo Provera 3. Follow up in: 1 week for UPT and possible Depo Provera and then at age 66 years for pap smear/annual exam  or sooner as needed.    Marny Lowenstein, PA-C  10/28/2015 2:09 PM

## 2015-11-04 ENCOUNTER — Ambulatory Visit: Payer: Medicaid Other

## 2017-04-03 NOTE — L&D Delivery Note (Signed)
Delivery Note Pt became complete at 2224 and pushed well. At 11:12 PM a viable female was delivered via Vaginal, Spontaneous (Presentation: ROA).  APGAR: 9, 9; weight: pending.  Infant dried and placed on pt's abd. Cord clamped and cut after 1 min by family member; hospital cord blood sample collected. Placenta status: spont , intact .  Cord: 3 vessel  Anesthesia:  epidural Episiotomy: None Lacerations:  None Est. Blood Loss (mL): 25  Mom to postpartum.  Baby to Couplet care / Skin to Skin.  Kathleen Cooke 03/08/2018, 11:27 PM  Please schedule this patient for Postpartum visit in: 4 weeks with the following provider: Any provider For C/S patients schedule nurse incision check in weeks 2 weeks: no High risk pregnancy complicated by: cholestasis Delivery mode:  SVD Anticipated Birth Control:  Depo PP Procedures needed: none  Schedule Integrated BH visit: no

## 2017-07-06 ENCOUNTER — Emergency Department (HOSPITAL_COMMUNITY)
Admission: EM | Admit: 2017-07-06 | Discharge: 2017-07-06 | Disposition: A | Discharge status: Home / Self Care | Payer: Self-pay | Attending: Emergency Medicine | Admitting: Emergency Medicine

## 2017-07-06 ENCOUNTER — Encounter (HOSPITAL_COMMUNITY): Payer: Self-pay | Admitting: *Deleted

## 2017-07-06 ENCOUNTER — Emergency Department (HOSPITAL_COMMUNITY): Payer: Self-pay

## 2017-07-06 ENCOUNTER — Other Ambulatory Visit: Payer: Self-pay

## 2017-07-06 DIAGNOSIS — J45909 Unspecified asthma, uncomplicated: Secondary | ICD-10-CM | POA: Insufficient documentation

## 2017-07-06 DIAGNOSIS — Z79899 Other long term (current) drug therapy: Secondary | ICD-10-CM | POA: Insufficient documentation

## 2017-07-06 DIAGNOSIS — R0789 Other chest pain: Secondary | ICD-10-CM | POA: Insufficient documentation

## 2017-07-06 LAB — CBC
HCT: 37.9 % (ref 36.0–46.0)
Hemoglobin: 12.1 g/dL (ref 12.0–15.0)
MCH: 26.6 pg (ref 26.0–34.0)
MCHC: 31.9 g/dL (ref 30.0–36.0)
MCV: 83.3 fL (ref 78.0–100.0)
Platelets: 300 10*3/uL (ref 150–400)
RBC: 4.55 MIL/uL (ref 3.87–5.11)
RDW: 13.6 % (ref 11.5–15.5)
WBC: 8.7 10*3/uL (ref 4.0–10.5)

## 2017-07-06 LAB — I-STAT TROPONIN, ED: Troponin i, poc: 0 ng/mL (ref 0.00–0.08)

## 2017-07-06 LAB — I-STAT BETA HCG BLOOD, ED (MC, WL, AP ONLY): I-stat hCG, quantitative: <5 m[IU]/mL (ref <5)

## 2017-07-06 LAB — BASIC METABOLIC PANEL
Anion gap: 10 (ref 5–15)
BUN: 5 mg/dL — ABNORMAL LOW (ref 6–20)
CO2: 22 mmol/L (ref 22–32)
Calcium: 8.9 mg/dL (ref 8.9–10.3)
Chloride: 106 mmol/L (ref 101–111)
Creatinine, Ser: 0.57 mg/dL (ref 0.44–1.00)
GFR calc Af Amer: >60 mL/min (ref >60)
GFR calc non Af Amer: >60 mL/min (ref >60)
Glucose, Bld: 99 mg/dL (ref 65–99)
Potassium: 3.8 mmol/L (ref 3.5–5.1)
Sodium: 138 mmol/L (ref 135–145)

## 2017-07-06 MED ORDER — IBUPROFEN 400 MG PO TABS
600.0000 mg | ORAL_TABLET | Freq: Once | ORAL | Status: AC
  Administered 2017-07-06: 600 mg via ORAL
  Filled 2017-07-06: qty 1

## 2017-07-06 NOTE — ED Triage Notes (Signed)
Pt in c/o mid radiating CP to mid upper back onset today at 7am, c/o SOB, denies n/v/d, denies Cardiac hx, no recent travel, pt A&O x4

## 2017-07-06 NOTE — ED Provider Notes (Signed)
[facility]  CSN: [id number] Arrival date & time: 07/06/17 1111  Chief Complaint(s) Chest Pain  HPI Kathleen Cooke is a 22 y.o. female   The history is provided by the patient.  Chest Pain   This is a new problem. Episode onset: 9 hrs. The problem occurs constantly. The problem has not changed since onset.The pain is associated with movement. Pain location: sternal. The pain is moderate. The quality of the pain is described as sharp and stabbing. The pain does not radiate. The symptoms are aggravated by certain positions. Pertinent negatives include no back pain, no cough, no fever, no headaches, no irregular heartbeat, no leg pain, no lower extremity edema, no nausea, no orthopnea, no shortness of breath and no vomiting. She has tried nothing for the symptoms.  Pertinent negatives for past medical history include no CAD, no diabetes, no DVT, no hyperlipidemia, no hypertension and no MI.    Past Medical History Past Medical History:  Diagnosis Date  . Asthma   . Headache    There are no active problems to display for this patient.  Home Medication(s) Prior to Admission medications   Medication Sig Start Date End Date Taking? Authorizing Provider  albuterol (PROVENTIL HFA;VENTOLIN HFA) 108 (90 Base) MCG/ACT inhaler Inhale 1-2 puffs into the lungs every 6 (six) hours as needed for wheezing or shortness of breath. Patient not taking: Reported on 10/28/2015 06/22/15   Judeth HornLawrence, Erin, NP  ibuprofen (ADVIL,MOTRIN) 600 MG tablet Take 1 tablet (600 mg total) by mouth every 6 (six) hours. Patient not taking: Reported on 10/28/2015 08/23/15   Montez MoritaLawson, Marie D, CNM  Prenatal Vit-Fe Fumarate-FA (PRENATAL MULTIVITAMIN) TABS tablet Take 1 tablet by mouth daily at 12 noon. 10/28/15   Marny LowensteinWenzel, Julie N, PA-C                                                                                                                                    Past Surgical  History Past Surgical History:  Procedure Laterality Date  . NO PAST SURGERIES     Family History Family History  Problem Relation Age of Onset  . Birth defects Neg Hx   . Diabetes Neg Hx   . Early death Neg Hx   . Heart disease Neg Hx   . Hypertension Neg Hx   . Stroke Neg Hx   . Miscarriages / Stillbirths Neg Hx     Social History Social History   Tobacco Use  . Smoking status: Never Smoker  . Smokeless tobacco: Never Used  Substance Use Topics  . Alcohol use: No  . Drug use: Yes    Types: Marijuana    Comment: used cocaine 1.5 yrs ago    Allergies Patient has no known allergies.  Review of Systems Review of Systems  Constitutional: Negative for fever.  Respiratory: Negative for cough and shortness of breath.   Cardiovascular: Positive for  chest pain. Negative for orthopnea.  Gastrointestinal: Negative for nausea and vomiting.  Musculoskeletal: Negative for back pain.  Neurological: Negative for headaches.   All other systems are reviewed and are negative for acute change except as noted in the HPI  Physical Exam Vital Signs  I have reviewed the triage vital signs BP 108/70   Pulse 69   Temp 98.9 F (37.2 C) (Oral)   Resp 14   Ht 5\' 2"  (1.575 m)   Wt 68 kg (150 lb)   LMP 06/29/2017   SpO2 99%   Breastfeeding? No   BMI 27.44 kg/m   Physical Exam  Constitutional: She is oriented to person, place, and time. She appears well-developed and well-nourished. No distress.  HENT:  Head: Normocephalic and atraumatic.  Nose: Nose normal.  Eyes: Pupils are equal, round, and reactive to light. Conjunctivae and EOM are normal. Right eye exhibits no discharge. Left eye exhibits no discharge. No scleral icterus.  Neck: Normal range of motion. Neck supple.  Cardiovascular: Normal rate and regular rhythm. Exam reveals no gallop and no friction rub.  No murmur heard. Pulmonary/Chest: Effort normal and breath sounds normal. No stridor. No respiratory distress. She has  no rales. She exhibits tenderness.    Abdominal: Soft. She exhibits no distension. There is no tenderness.  Musculoskeletal: She exhibits no edema or tenderness.  Neurological: She is alert and oriented to person, place, and time.  Skin: Skin is warm and dry. No rash noted. She is not diaphoretic. No erythema.  Psychiatric: She has a normal mood and affect.  Vitals reviewed.   ED Results and Treatments Labs (all labs ordered are listed, but only abnormal results are displayed) Labs Reviewed  BASIC METABOLIC PANEL - Abnormal; Notable for the following components:      Result Value   BUN 5 (*)    All other components within normal limits  CBC  I-STAT TROPONIN, ED  I-STAT BETA HCG BLOOD, ED ([facility], [facility], [facility] ONLY)                                                                                                                         EKG  EKG Interpretation  Date/Time:  Friday July 06 2017 11:18:41 EDT Ventricular Rate:  81 PR Interval:  128 QRS Duration: 82 QT Interval:  376 QTC Calculation: 436 R Axis:   82 Text Interpretation:  Normal sinus rhythm Normal ECG NO STEMI No old tracing to compare Confirmed by Drema Pry [phone]) on 07/06/2017 4:02:26 PM      Radiology Dg Chest 2 View  Result Date: 07/06/2017 CLINICAL DATA:  Pt has CP that started at 7 am this morning. Pt state her pain is across her chest and radiates into her upper back and her neck. PT is also have some mild SOB. Pt is a current smoker, 2 black and mild's a day. She has been smoking since she was 14. EXAM: CHEST - 2 VIEW COMPARISON:  none FINDINGS: Lungs  are clear. Heart size and mediastinal contours are within normal limits. No effusion. Visualized bones unremarkable. IMPRESSION: No acute cardiopulmonary disease. Electronically Signed   By: Corlis Leak M.D.   On: 07/06/2017 12:09   Pertinent labs & imaging results that were available during my care of the patient were reviewed by me and considered in my medical  decision making (see chart for details).  Medications Ordered in ED Medications  ibuprofen (ADVIL,MOTRIN) tablet 600 mg (has no administration in time range)                                                                                                                                    Procedures Procedures  (including critical care time)  Medical Decision Making / ED Course I have reviewed the nursing notes for this encounter and the patient's prior records (if available in EHR or on provided paperwork).    Chest pain highly consistent with ACS.  Most consistent with MSK and chest wall pain.  EKG without acute ischemic changes or evidence of pericarditis.  Labs obtained in triage were grossly reassuring with a negative troponin at 5-hour mark.  Feel this is sufficient to rule out ACS and do not feel additional cardiac workup is necessary at this time.  Low pretest probability for pulmonary embolism patient is PERC negative.  Presentation is not classic for aortic dissection or esophageal perforation.Chest x-ray without evidence suggestive of pneumonia, pneumothorax, pneumomediastinum.  No abnormal contour of the mediastinum to suggest dissection. No evidence of acute injuries.  The patient appears reasonably screened and/or stabilized for discharge and I doubt any other medical condition or other [facility] requiring further screening, evaluation, or treatment in the ED at this time prior to discharge.  The patient is safe for discharge with strict return precautions.   Final Clinical Impression(s) / ED Diagnoses Final diagnoses:  Chest wall pain      This chart was dictated using voice recognition software.  Despite best efforts to proofread,  errors can occur which can change the documentation meaning.   Nira Conn, MD 07/06/17 [phone]

## 2017-08-19 ENCOUNTER — Encounter (HOSPITAL_COMMUNITY): Payer: Self-pay | Admitting: Emergency Medicine

## 2017-08-19 ENCOUNTER — Other Ambulatory Visit: Payer: Self-pay

## 2017-08-19 ENCOUNTER — Emergency Department (HOSPITAL_COMMUNITY)
Admission: EM | Admit: 2017-08-19 | Discharge: 2017-08-19 | Disposition: A | Discharge status: Home / Self Care | Payer: Self-pay | Attending: Emergency Medicine | Admitting: Emergency Medicine

## 2017-08-19 ENCOUNTER — Emergency Department (HOSPITAL_COMMUNITY): Payer: Self-pay

## 2017-08-19 DIAGNOSIS — Z3A08 8 weeks gestation of pregnancy: Secondary | ICD-10-CM | POA: Insufficient documentation

## 2017-08-19 DIAGNOSIS — O200 Threatened abortion: Secondary | ICD-10-CM | POA: Insufficient documentation

## 2017-08-19 DIAGNOSIS — R51 Headache: Secondary | ICD-10-CM | POA: Insufficient documentation

## 2017-08-19 DIAGNOSIS — O26891 Other specified pregnancy related conditions, first trimester: Secondary | ICD-10-CM | POA: Insufficient documentation

## 2017-08-19 DIAGNOSIS — O99511 Diseases of the respiratory system complicating pregnancy, first trimester: Secondary | ICD-10-CM | POA: Insufficient documentation

## 2017-08-19 DIAGNOSIS — J45909 Unspecified asthma, uncomplicated: Secondary | ICD-10-CM | POA: Insufficient documentation

## 2017-08-19 DIAGNOSIS — R519 Headache, unspecified: Secondary | ICD-10-CM

## 2017-08-19 LAB — CBC WITH DIFFERENTIAL/PLATELET
Abs Immature Granulocytes: 0.1 10*3/uL (ref 0.0–0.1)
Basophils Absolute: 0.1 10*3/uL (ref 0.0–0.1)
Basophils Relative: 0 %
Eosinophils Absolute: 0.1 10*3/uL (ref 0.0–0.7)
Eosinophils Relative: 1 %
HCT: 39.6 % (ref 36.0–46.0)
Hemoglobin: 13.2 g/dL (ref 12.0–15.0)
Immature Granulocytes: 0 %
Lymphocytes Relative: 13 %
Lymphs Abs: 1.7 10*3/uL (ref 0.7–4.0)
MCH: 27.5 pg (ref 26.0–34.0)
MCHC: 33.3 g/dL (ref 30.0–36.0)
MCV: 82.5 fL (ref 78.0–100.0)
Monocytes Absolute: 0.7 10*3/uL (ref 0.1–1.0)
Monocytes Relative: 5 %
Neutro Abs: 11.1 10*3/uL — ABNORMAL HIGH (ref 1.7–7.7)
Neutrophils Relative %: 81 %
Platelets: 275 10*3/uL (ref 150–400)
RBC: 4.8 MIL/uL (ref 3.87–5.11)
RDW: 13.1 % (ref 11.5–15.5)
WBC: 13.7 10*3/uL — ABNORMAL HIGH (ref 4.0–10.5)

## 2017-08-19 LAB — BASIC METABOLIC PANEL
Anion gap: 14 (ref 5–15)
BUN: 5 mg/dL — ABNORMAL LOW (ref 6–20)
CO2: 18 mmol/L — ABNORMAL LOW (ref 22–32)
Calcium: 8.9 mg/dL (ref 8.9–10.3)
Chloride: 102 mmol/L (ref 101–111)
Creatinine, Ser: 0.58 mg/dL (ref 0.44–1.00)
GFR calc Af Amer: >60 mL/min (ref >60)
GFR calc non Af Amer: >60 mL/min (ref >60)
Glucose, Bld: 79 mg/dL (ref 65–99)
Potassium: 3.7 mmol/L (ref 3.5–5.1)
Sodium: 134 mmol/L — ABNORMAL LOW (ref 135–145)

## 2017-08-19 LAB — I-STAT BETA HCG BLOOD, ED (MC, WL, AP ONLY): I-stat hCG, quantitative: >2000 m[IU]/mL — ABNORMAL HIGH (ref <5)

## 2017-08-19 LAB — HCG, QUANTITATIVE, PREGNANCY: hCG, Beta Chain, Quant, S: 82982 m[IU]/mL — ABNORMAL HIGH (ref <5)

## 2017-08-19 MED ORDER — KETOROLAC TROMETHAMINE 30 MG/ML IJ SOLN
30.0000 mg | Freq: Once | INTRAMUSCULAR | Status: AC
  Administered 2017-08-19: 30 mg via INTRAVENOUS
  Filled 2017-08-19: qty 1

## 2017-08-19 MED ORDER — METHYLPREDNISOLONE SODIUM SUCC 125 MG IJ SOLR
125.0000 mg | Freq: Once | INTRAMUSCULAR | Status: AC
Start: 2017-08-19 — End: 2017-08-19
  Administered 2017-08-19: 125 mg via INTRAVENOUS
  Filled 2017-08-19: qty 2

## 2017-08-19 MED ORDER — METOCLOPRAMIDE HCL 5 MG/ML IJ SOLN
10.0000 mg | Freq: Once | INTRAMUSCULAR | Status: AC
  Administered 2017-08-19: 10 mg via INTRAVENOUS
  Filled 2017-08-19: qty 2

## 2017-08-19 MED ORDER — SODIUM CHLORIDE 0.9 % IV BOLUS
1000.0000 mL | Freq: Once | INTRAVENOUS | Status: AC
  Administered 2017-08-19: 1000 mL via INTRAVENOUS

## 2017-08-19 NOTE — ED Provider Notes (Addendum)
[facility] EMERGENCY DEPARTMENT Provider Note   CSN: [id number] Arrival date & time: 08/19/17  0405     History   Chief Complaint Chief Complaint  Patient presents with  . Migraine    HPI Kathleen Cooke is a 22 y.o. female.  HPI   Kathleen Cooke is a 22 y.o. female, with a history of asthma and headaches, presenting to the ED with headache for the last week. Pain is intermittent, resolves with excedrin or ibuprofen, but then returns. Pain is mostly left sided, throbbing, 10/10, nonradiating. Consistent with previous headaches. Accompanied by photophobia and N/V. Endorses body aches, rhinorrhea, sneezing, congestion, nonproductive cough, sore throat, and diarrhea beginning yesterday. Also states a few days ago  Currently menstruating for last 4 days, and last one before this one was around April 15. Denies fever/chills, CP, SOB, neuro deficits, dizziness, neck pain/stiffness, rash, abdominal pain, falls/trauma, or any other complaints.     Past Medical History:  Diagnosis Date  . Asthma   . Headache     There are no active problems to display for this patient.   Past Surgical History:  Procedure Laterality Date  . NO PAST SURGERIES       OB History    Gravida  1   Para  1   Term  1   Preterm      AB      Living  1     SAB      TAB      Ectopic      Multiple  0   Live Births  1            Home Medications    Prior to Admission medications   Medication Sig Start Date End Date Taking? Authorizing Provider  albuterol (PROVENTIL HFA;VENTOLIN HFA) 108 (90 Base) MCG/ACT inhaler Inhale 1-2 puffs into the lungs every 6 (six) hours as needed for wheezing or shortness of breath. Patient not taking: Reported on 10/28/2015 06/22/15   Judeth Horn, NP  ibuprofen (ADVIL,MOTRIN) 600 MG tablet Take 1 tablet (600 mg total) by mouth every 6 (six) hours. Patient not taking: Reported on 10/28/2015 08/23/15   Montez Morita, CNM  Prenatal  Vit-Fe Fumarate-FA (PRENATAL MULTIVITAMIN) TABS tablet Take 1 tablet by mouth daily at 12 noon. Patient not taking: Reported on 08/19/2017 10/28/15   Marny Lowenstein, PA-C    Family History Family History  Problem Relation Age of Onset  . Birth defects Neg Hx   . Diabetes Neg Hx   . Early death Neg Hx   . Heart disease Neg Hx   . Hypertension Neg Hx   . Stroke Neg Hx   . Miscarriages / Stillbirths Neg Hx     Social History Social History   Tobacco Use  . Smoking status: Never Smoker  . Smokeless tobacco: Never Used  Substance Use Topics  . Alcohol use: No  . Drug use: Yes    Types: Marijuana    Comment: used cocaine 1.5 yrs ago      Allergies   Patient has no known allergies.   Review of Systems Review of Systems  Constitutional: Negative for chills and fever.  HENT: Positive for rhinorrhea, sinus pain, sneezing and sore throat. Negative for trouble swallowing and voice change.   Eyes: Positive for photophobia.  Respiratory: Positive for cough. Negative for shortness of breath.   Cardiovascular: Negative for chest pain.  Gastrointestinal: Positive for diarrhea, nausea and vomiting. Negative for abdominal pain.  Musculoskeletal: Positive for myalgias. Negative for neck pain and neck stiffness.  Neurological: Positive for headaches. Negative for dizziness, weakness, light-headedness and numbness.  All other systems reviewed and are negative.    Physical Exam Updated Vital Signs BP 112/76 (BP Location: Right Arm)   Pulse 81   Temp 98 F (36.7 C) (Oral)   Resp 16   LMP 08/15/2017 (Exact Date)   SpO2 98%   Physical Exam  Constitutional: She appears well-developed and well-nourished. No distress.  HENT:  Head: Normocephalic and atraumatic.  Nose: Right sinus exhibits frontal sinus tenderness. Left sinus exhibits frontal sinus tenderness.  Eyes: Pupils are equal, round, and reactive to light. Conjunctivae and EOM are normal.  Neck: Normal range of motion and  full passive range of motion without pain. Neck supple. No neck rigidity.  Cardiovascular: Normal rate, regular rhythm, normal heart sounds and intact distal pulses.  Pulmonary/Chest: Effort normal and breath sounds normal. No respiratory distress.  Abdominal: Soft. She exhibits no distension. There is no tenderness. There is no guarding.  Genitourinary:  Genitourinary Comments: External genitalia normal Vagina with discharge - Small drop of bright red blood at cervical os, no other signs of hemorrhage noted. Cervix  Normal - cervical os appears closed negative for cervical motion tenderness Adnexa palpated, no masses, negative for tenderness noted Bladder palpated negative for tenderness Uterus palpated no masses, negative for tenderness  No inguinal lymphadenopathy. Otherwise normal female genitalia. RN, Florentina Addison, served as Biomedical engineer during exam.  Musculoskeletal: She exhibits no edema.  Lymphadenopathy:    She has no cervical adenopathy.  Neurological: She is alert.  No sensory deficits.  No noted speech deficits. No aphasia. Patient handles oral secretions without difficulty. No noted swallowing defects.  Equal grip strength bilaterally. Strength 5/5 in the upper extremities. Strength 5/5 with flexion and extension of the hips, knees, and ankles bilaterally.  Patellar DTRs 2+ bilaterally. Negative Romberg. No gait disturbance.  Coordination intact including heel to shin and finger to nose.  Cranial nerves III-XII grossly intact.  No facial droop.   Skin: Skin is warm and dry. She is not diaphoretic.  Psychiatric: She has a normal mood and affect. Her behavior is normal.  Nursing note and vitals reviewed.    ED Treatments / Results  Labs (all labs ordered are listed, but only abnormal results are displayed) Labs Reviewed  BASIC METABOLIC PANEL - Abnormal; Notable for the following components:      Result Value   Sodium 134 (*)    CO2 18 (*)    BUN 5 (*)    All other  components within normal limits  CBC WITH DIFFERENTIAL/PLATELET - Abnormal; Notable for the following components:   WBC 13.7 (*)    Neutro Abs 11.1 (*)    All other components within normal limits  HCG, QUANTITATIVE, PREGNANCY - Abnormal; Notable for the following components:   hCG, Beta Chain, Quant, S 16,109 (*)    All other components within normal limits  I-STAT BETA HCG BLOOD, ED ([facility], [facility], [facility] ONLY) - Abnormal; Notable for the following components:   I-stat hCG, quantitative >2,000.0 (*)    All other components within normal limits    EKG None  Radiology US Ob Comp < 14 Wks  Result Date: 08/19/2017 CLINICAL DATA:  Early pregnancy.  Vaginal bleeding EXAM: OBSTETRIC <14 WK [place] AND TRANSVAGINAL OB US TECHNIQUE: Both transabdominal and transvaginal ultrasound examinations were performed for complete evaluation of the gestation as well as the maternal uterus, adnexal regions,  and pelvic cul-de-sac. Transvaginal technique was performed to assess early pregnancy. COMPARISON:  Pelvic ultrasound 07/14/2015 FINDINGS: Intrauterine gestational sac: Single Yolk sac:  Present Embryo:  Present Cardiac Activity: Present Heart Rate: 139 bpm CRL:  18.8 mm   8 w   3 d                  [place] EDC: 03/28/2018 Subchorionic hemorrhage:  None visualized. Maternal uterus/adnexae: Normal RIGHT ovary. LEFT ovary not identified. No free fluid IMPRESSION: 1. Single intrauterine gestation with embryo and normal cardiac activity. 2. Estimated gestational age by crown rump length equals 8 weeks 3 days. Electronically Signed   By: Genevive Bi M.D.   On: 08/19/2017 10:01   US Ob Transvaginal  Result Date: 08/19/2017 CLINICAL DATA:  Early pregnancy.  Vaginal bleeding EXAM: OBSTETRIC <14 WK [place] AND TRANSVAGINAL OB US TECHNIQUE: Both transabdominal and transvaginal ultrasound examinations were performed for complete evaluation of the gestation as well as the maternal uterus, adnexal regions, and pelvic cul-de-sac. Transvaginal  technique was performed to assess early pregnancy. COMPARISON:  Pelvic ultrasound 07/14/2015 FINDINGS: Intrauterine gestational sac: Single Yolk sac:  Present Embryo:  Present Cardiac Activity: Present Heart Rate: 139 bpm CRL:  18.8 mm   8 w   3 d                  [place] EDC: 03/28/2018 Subchorionic hemorrhage:  None visualized. Maternal uterus/adnexae: Normal RIGHT ovary. LEFT ovary not identified. No free fluid IMPRESSION: 1. Single intrauterine gestation with embryo and normal cardiac activity. 2. Estimated gestational age by crown rump length equals 8 weeks 3 days. Electronically Signed   By: Genevive Bi M.D.   On: 08/19/2017 10:01    Procedures Pelvic exam Date/Time: 08/19/2017 11:10 AM Performed by: Anselm Pancoast, PA-C Authorized by: Anselm Pancoast, PA-C  Consent: Verbal consent obtained. Risks and benefits: risks, benefits and alternatives were discussed Consent given by: patient Patient identity confirmed: verbally with patient and provided demographic data Local anesthesia used: no  Anesthesia: Local anesthesia used: no  Sedation: Patient sedated: no  Patient tolerance: Patient tolerated the procedure well with no immediate complications    (including critical care time)  Medications Ordered in ED Medications  sodium chloride 0.9 % bolus 1,000 mL (0 mLs Intravenous Stopped 08/19/17 0828)  ketorolac (TORADOL) 30 MG/ML injection 30 mg (30 mg Intravenous Given 08/19/17 0707)  metoCLOPramide (REGLAN) injection 10 mg (10 mg Intravenous Given 08/19/17 0707)  methylPREDNISolone sodium succinate (SOLU-MEDROL) 125 mg/2 mL injection 125 mg (125 mg Intravenous Given 08/19/17 0707)     Initial Impression / Assessment and Plan / ED Course  I have reviewed the triage vital signs and the nursing notes.  Pertinent labs & imaging results that were available during my care of the patient were reviewed by me and considered in my medical decision making (see chart for details).  Clinical Course  as of Aug 20 1114  Sun Aug 19, 2017  1610 Discussed this finding with the patient.  Patient states she currently has vaginal bleeding "like a period."  Her bleeding is not heavier than normal, she is changing a regular size tampon about every 2 hours.  Her last menstrual cycle was around April 15.  She denies any abdominal pain. Previous pregnancy two years ago.  I-stat hCG, quantitative(!): >2,000.0 [SJ]  0740 Patient states her headache feels much better.    [SJ]  0740 Patient reportedly lying on the same side as her BP cuff.  BP cuff also noted to be in incorrect position.  BP(!): 84/56 [SJ]    Clinical Course User Index [SJ] Javarie Crisp C, PA-C    Patient presents with complaint of headache along with nausea, vomiting, congestion, and sinus pressure.  No neurologic deficits.  Symptoms resolved with conservative management. Medications for patient's headache were ordered and given before pregnancy test resulted. Initial medication orders given based on the regularity of patient's menstrual cycles and the fact that she stated she is currently menstruating.   Patient noted to have IUP measured at [redacted]w[redacted]d. No noted hemorrhage on [place]. Scant bleeding on pelvic exam. Suspect threatened miscarriage based on history and ultrasound findings.  Nontoxic-appearing, not tachycardic, not tachypneic, and in no apparent distress.  Vital signs stable.  Hemoglobin within normal range. Rh positive noted from previous pregnancy. Patient to follow-up with OB/GYN.  The patient was given instructions for home care as well as return precautions. Patient voices understanding of these instructions, accepts the plan, and is comfortable with discharge.  Findings and plan of care discussed with Loren Racer, MD.   Vitals:   08/19/17 5284 08/19/17 0828 08/19/17 0900 08/19/17 0945  BP: 92/72 101/60 104/60 99/66  Pulse: 82 81 79 67  Resp: Temp: 98.2 F (36.8 C)     TempSrc: Oral     SpO2:  97% 97% 99%    Weight: 68 kg (150 lb)     Height:  (1.575 m)        Final Clinical Impressions(s) / ED Diagnoses   Final diagnoses:  Bad headache  Threatened miscarriage  [redacted] weeks gestation of pregnancy    ED Discharge Orders    None       Concepcion Living 08/19/17 1117    Anselm Pancoast, PA-C 08/19/17 1128    Loren Racer, MD 08/23/17 1315

## 2017-08-19 NOTE — ED Notes (Signed)
Care assumed at this time - resting quietly on stretcher with eyes closed - easily arousable - states feeling "better" - first liter NS infusing wide open without difficulty

## 2017-08-19 NOTE — Discharge Instructions (Addendum)
You had a positive pregnancy test here in the ED.  Ultrasound noted that there is a pregnancy in the uterus estimated to be 8 weeks, 3 days. Estimated due date: 03/28/2018 The combination of a pregnancy with vaginal bleeding means that this may be a threatened miscarriage.  Your pregnancy has not ended.  Most of the time, bleeding will stop on its own and the pregnancy will continue normally.  Please follow up with OBGYN as soon as possible on this matter.  Should you begin to have increased vaginal bleeding, abdominal pain, fever, chest pain, shortness of breath, or any other major concerns connected with the pregnancy, please proceed directly to the emergency department at [facility].  For management of headaches, you may take Tylenol.  It is also very important that you stay well hydrated by drinking plenty of water.  Have a goal of around 0.5 L every hour or two. For nasal congestion and sinus pressure you may try sinus rinses.

## 2017-08-19 NOTE — ED Triage Notes (Signed)
Pt reports fighting a migraine for over a week, pt reports nausea/emesis, and light sensitivity.

## 2017-08-19 NOTE — ED Notes (Signed)
Transported to US via stretcher per rad tech 

## 2017-09-29 ENCOUNTER — Encounter: Payer: Self-pay | Admitting: Emergency Medicine

## 2017-09-29 ENCOUNTER — Emergency Department (HOSPITAL_COMMUNITY)
Admission: EM | Admit: 2017-09-29 | Discharge: 2017-09-30 | Disposition: A | Discharge status: Home / Self Care | Payer: Self-pay | Attending: Emergency Medicine | Admitting: Emergency Medicine

## 2017-09-29 DIAGNOSIS — Z3A14 14 weeks gestation of pregnancy: Secondary | ICD-10-CM | POA: Insufficient documentation

## 2017-09-29 DIAGNOSIS — R102 Pelvic and perineal pain: Secondary | ICD-10-CM | POA: Insufficient documentation

## 2017-09-29 DIAGNOSIS — O4692 Antepartum hemorrhage, unspecified, second trimester: Secondary | ICD-10-CM

## 2017-09-29 DIAGNOSIS — O99512 Diseases of the respiratory system complicating pregnancy, second trimester: Secondary | ICD-10-CM | POA: Insufficient documentation

## 2017-09-29 DIAGNOSIS — O4690 Antepartum hemorrhage, unspecified, unspecified trimester: Secondary | ICD-10-CM

## 2017-09-29 DIAGNOSIS — J45909 Unspecified asthma, uncomplicated: Secondary | ICD-10-CM | POA: Insufficient documentation

## 2017-09-29 DIAGNOSIS — O208 Other hemorrhage in early pregnancy: Secondary | ICD-10-CM | POA: Insufficient documentation

## 2017-09-29 LAB — BASIC METABOLIC PANEL
Anion gap: 9 (ref 5–15)
BUN: <5 mg/dL — ABNORMAL LOW (ref 6–20)
CO2: 23 mmol/L (ref 22–32)
Calcium: 8.8 mg/dL — ABNORMAL LOW (ref 8.9–10.3)
Chloride: 105 mmol/L (ref 98–111)
Creatinine, Ser: 0.45 mg/dL (ref 0.44–1.00)
GFR calc Af Amer: >60 mL/min (ref >60)
GFR calc non Af Amer: >60 mL/min (ref >60)
Glucose, Bld: 90 mg/dL (ref 70–99)
Potassium: 3.6 mmol/L (ref 3.5–5.1)
Sodium: 137 mmol/L (ref 135–145)

## 2017-09-29 LAB — CBC
HCT: 33.8 % — ABNORMAL LOW (ref 36.0–46.0)
Hemoglobin: 11.1 g/dL — ABNORMAL LOW (ref 12.0–15.0)
MCH: 27.7 pg (ref 26.0–34.0)
MCHC: 32.8 g/dL (ref 30.0–36.0)
MCV: 84.3 fL (ref 78.0–100.0)
Platelets: 298 10*3/uL (ref 150–400)
RBC: 4.01 MIL/uL (ref 3.87–5.11)
RDW: 13 % (ref 11.5–15.5)
WBC: 10 10*3/uL (ref 4.0–10.5)

## 2017-09-29 LAB — I-STAT BETA HCG BLOOD, ED (MC, WL, AP ONLY): I-stat hCG, quantitative: >2000 m[IU]/mL — ABNORMAL HIGH (ref <5)

## 2017-09-29 MED ORDER — ONDANSETRON 4 MG PO TBDP
8.0000 mg | ORAL_TABLET | Freq: Once | ORAL | Status: AC
Start: 2017-09-29 — End: 2017-09-29
  Administered 2017-09-29: 8 mg via ORAL
  Filled 2017-09-29: qty 2

## 2017-09-29 NOTE — ED Triage Notes (Signed)
Pt presents to ED for assessment of 2 months of vaginal bleeding with large clots starting about 1 month ago.  C/o intermittent, intense lower abdominal pain.  C/o nausea and emesis whenever she tries to eat anything.  Denies diarrhea, denies changes in urination.

## 2017-09-29 NOTE — ED Provider Notes (Signed)
[facility] EMERGENCY DEPARTMENT Provider Note   CSN: [id number] Arrival date & time: 09/29/17  2024     History   Chief Complaint Chief Complaint  Patient presents with  . Vaginal Bleeding    HPI Kathleen Cooke is a 22 y.o.G2P1 female who presents with vaginal bleeding. She states that she has had vaginal bleeding every day for the past several weeks, she estimates over a month. She goes through ~3 pads a day. She was seen in the ED last month and had a positive pregnancy test with her quant ~82k. She has had continuous nausea and vomiting and has had difficulty keeping anything down. She also reports a headache and feels lightheaded. She has not taken anything for her symptoms. She did not follow up with OBGYN after her ED visit because she "did not feel like it". Pelvic US at that time showed IUP ~ 8 weeks, 3 days. She also has had intense lower pelvic cramping. She is O+ blood type.  HPI  Past Medical History:  Diagnosis Date  . Asthma   . Headache     There are no active problems to display for this patient.   Past Surgical History:  Procedure Laterality Date  . NO PAST SURGERIES       OB History    Gravida  1   Para  1   Term  1   Preterm      AB      Living  1     SAB      TAB      Ectopic      Multiple  0   Live Births  1            Home Medications    Prior to Admission medications   Not on File    Family History Family History  Problem Relation Age of Onset  . Birth defects Neg Hx   . Diabetes Neg Hx   . Early death Neg Hx   . Heart disease Neg Hx   . Hypertension Neg Hx   . Stroke Neg Hx   . Miscarriages / Stillbirths Neg Hx     Social History Social History   Tobacco Use  . Smoking status: Never Smoker  . Smokeless tobacco: Never Used  Substance Use Topics  . Alcohol use: No  . 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   Review of Systems Review of Systems  Constitutional: Negative for fever.  Gastrointestinal: Positive for abdominal pain, nausea and vomiting.  Genitourinary: Positive for pelvic pain and vaginal bleeding. Negative for dysuria and flank pain.  Neurological: Positive for light-headedness and headaches.  All other systems reviewed and are negative.    Physical Exam Updated Vital Signs BP 107/89 (BP Location: Right Arm)   Pulse 88   Temp 98.5 F (36.9 C) (Oral)   Resp 16   SpO2 96%   Physical Exam  Constitutional: She is oriented to person, place, and time. She appears well-developed and well-nourished. No distress.  HENT:  Head: Normocephalic and atraumatic.  Eyes: Pupils are equal, round, and reactive to light. Conjunctivae are normal. Right eye exhibits no discharge. Left eye exhibits no discharge. No scleral icterus.  Neck: Normal range of motion.  Cardiovascular: Normal rate and regular rhythm.  Pulmonary/Chest: Effort normal and breath sounds normal. No  respiratory distress.  Abdominal: Soft. Bowel sounds are normal. She exhibits no distension. There is tenderness (lower abdominal tenderness).  Genitourinary:  Genitourinary Comments: Pelvic: No inguinal lymphadenopathy or inguinal hernia noted. Normal external genitalia. No pain with speculum insertion. Significant amount of bleeding in the vaginal vault. Cervix is slightly open. Chaperone present during exam.    Musculoskeletal:  Ankle bracelet on L ankle  Neurological: She is alert and oriented to person, place, and time.  Skin: Skin is warm and dry.  Psychiatric: She has a normal mood and affect. Her behavior is normal.  Nursing note and vitals reviewed.    ED Treatments / Results  Labs (all labs ordered are listed, but only abnormal results are displayed) Labs Reviewed  CBC - Abnormal; Notable for the following components:      Result Value   Hemoglobin 11.1 (*)    HCT 33.8 (*)    All other  components within normal limits  BASIC METABOLIC PANEL - Abnormal; Notable for the following components:   BUN <5 (*)    Calcium 8.8 (*)    All other components within normal limits  I-STAT BETA HCG BLOOD, ED ([facility], [facility], [facility] ONLY) - Abnormal; Notable for the following components:   I-stat hCG, quantitative >2,000.0 (*)    All other components within normal limits  HCG, QUANTITATIVE, PREGNANCY    EKG None  Radiology No results found.  Procedures Procedures (including critical care time)  Medications Ordered in ED Medications  acetaminophen (TYLENOL) tablet 1,000 mg (has no administration in time range)  ondansetron (ZOFRAN-ODT) disintegrating tablet 8 mg (8 mg Oral Given 09/29/17 2300)     Initial Impression / Assessment and Plan / ED Course  I have reviewed the triage vital signs and the nursing notes.  Pertinent labs & imaging results that were available during my care of the patient were reviewed by me and considered in my medical decision making (see chart for details).  22 year old female with +pregnancy test and heavy vaginal bleeding. High concern for miscarriage with her symptoms and exam. Her vitals are normal.  CBC is remarkable for anemia (11.1) which is dropped from a month ago. BMP is normal. I-stat hcg is >2000. Formal hcg was sent which is pending at shift change. Repeat pelvic US was ordered. Care transferred to [facility] Upstill PA-C who will f/u on results. She is O+ and does not need Rhogam.  Final Clinical Impressions(s) / ED Diagnoses   Final diagnoses:  Vaginal bleeding in pregnancy, second trimester    ED Discharge Orders    None       Bethel BornGekas, Simcha Speir Marie, PA-C 09/30/17 0111    Loren RacerYelverton, David, MD 10/02/17 1650

## 2017-09-30 ENCOUNTER — Emergency Department (HOSPITAL_COMMUNITY): Payer: Self-pay

## 2017-09-30 LAB — HCG, QUANTITATIVE, PREGNANCY: hCG, Beta Chain, Quant, S: 84198 m[IU]/mL — ABNORMAL HIGH (ref <5)

## 2017-09-30 MED ORDER — ACETAMINOPHEN 500 MG PO TABS
1000.0000 mg | ORAL_TABLET | Freq: Once | ORAL | Status: AC
  Administered 2017-09-30: 1000 mg via ORAL
  Filled 2017-09-30: qty 2

## 2017-09-30 NOTE — Discharge Instructions (Addendum)
Follow up for your choice of pregnancy care. Return here as needed.

## 2017-09-30 NOTE — ED Provider Notes (Signed)
Patient care signed out by Terance HartKelly Gekas, PA-C, at end of shift with OB pelvic US pending in evaluation of vaginal bleeding during pregnancy. Sharene ButtersQuant is increasing when compared to previous recent visit. The patient is having some lower abdominal cramping. No fever.  US shows viable IUP with fetal heart beat. No hemorrhage. Normal amniotic fluid level. The results are shared with the patient who reports she is not planning on continuing the pregnancy to term. Discussed that she needed to act on her decision sooner than later. She is stable for discharge home.    Elpidio AnisUpstill, Caswell Alvillar, PA-C 09/30/17 [id number]    Nicanor AlconPalumbo, April, MD 09/30/17 [id number]

## 2017-10-25 ENCOUNTER — Encounter: Payer: Self-pay | Admitting: Student

## 2017-10-25 ENCOUNTER — Ambulatory Visit (INDEPENDENT_AMBULATORY_CARE_PROVIDER_SITE_OTHER): Payer: Self-pay | Admitting: Student

## 2017-10-25 VITALS — BP 100/66 | HR 70 | Wt 127.9 lb

## 2017-10-25 DIAGNOSIS — Z3480 Encounter for supervision of other normal pregnancy, unspecified trimester: Secondary | ICD-10-CM

## 2017-10-25 DIAGNOSIS — Z113 Encounter for screening for infections with a predominantly sexual mode of transmission: Secondary | ICD-10-CM

## 2017-10-25 DIAGNOSIS — Z3482 Encounter for supervision of other normal pregnancy, second trimester: Secondary | ICD-10-CM

## 2017-10-25 DIAGNOSIS — Z124 Encounter for screening for malignant neoplasm of cervix: Secondary | ICD-10-CM

## 2017-10-25 MED ORDER — ONDANSETRON HCL 8 MG PO TABS: 8.0000 mg | ORAL_TABLET | Freq: Three times a day (TID) | ORAL | 0 refills | Status: DC | PRN

## 2017-10-25 NOTE — Progress Notes (Signed)
Pt states did not plan pregnancy & does not want the baby.

## 2017-10-25 NOTE — Progress Notes (Signed)
Pt states last week passed a clot but in the last couple of days on & off just bloody show.

## 2017-10-25 NOTE — Progress Notes (Signed)
  Subjective:    Kathleen Cooke is being seen today for her first obstetrical visit.  This is not a planned pregnancy. She is at 2258w6d gestation. Her obstetrical history is significant for nothing. . Relationship with FOB: not together.  Patient does intend to breast feed. Pregnancy history fully reviewed.  Patient reports no complaints and nausea and vaginal bleeding. [facility], and told she had no problems with her placenta. She states she had one blood clot pass last week. .  She is considering her options for her pregnancy; wants to proceed with pregnancy for now although is also considering her options. Patient very tearful and upset; she wants to get her life together and this pregnancy is very upsetting to her.   Review of Systems:   Review of Systems  Constitutional: Negative.   Eyes: Negative.   Respiratory: Negative.   Cardiovascular: Negative.   Gastrointestinal: Negative.   Genitourinary: Negative.   Musculoskeletal: Negative.   Neurological: Negative.   Psychiatric/Behavioral: Negative.     Objective:     BP 100/66   Pulse 70   Wt 127 lb 14.4 oz (58 kg)   LMP 06/22/2017 (Approximate)   BMI 23.39 kg/m  Physical Exam  Constitutional: She appears well-developed.  HENT:  Head: Normocephalic.  Eyes: Pupils are equal, round, and reactive to light.  Neck: Normal range of motion.  Respiratory: Effort normal.  GI: Soft.  Genitourinary:  Genitourinary Comments: Normal external female genitalia. No lesions on outer labia, no lesions on vaginal walls. Cervix is pink; friable.   Musculoskeletal: Normal range of motion.  Neurological: She is alert.  Skin: Skin is warm.  Psychiatric: She has a normal mood and affect.    Exam    Assessment:    Pregnancy: G2P1001 Patient Active Problem List   Diagnosis Date Noted  . Supervision of other normal pregnancy, antepartum 10/25/2017       Plan:     Initial labs drawn. Prenatal  vitamins. Problem list reviewed and updated. AFP3 discussed: will do NIPS and AFP. Role of ultrasound in pregnancy discussed; fetal survey: ordered. Amniocentesis discussed: not indicated. Follow up in 4 weeks. 50% of 30 min visit spent on counseling and coordination of care.  -Low concern for bleeding as patient has had one blood clot pass and that was last week. Strong fetal heart rate today -See Asher MuirJamie next visit -RX for Zofran given.   Charlesetta GaribaldiKathryn Lorraine [facility] 10/25/2017

## 2017-10-25 NOTE — Patient Instructions (Signed)

## 2017-10-28 LAB — URINE CULTURE, OB REFLEX

## 2017-10-28 LAB — CULTURE, OB URINE

## 2017-10-29 ENCOUNTER — Other Ambulatory Visit: Payer: Self-pay | Admitting: Student

## 2017-10-29 DIAGNOSIS — R8271 Bacteriuria: Secondary | ICD-10-CM

## 2017-10-29 DIAGNOSIS — O9989 Other specified diseases and conditions complicating pregnancy, childbirth and the puerperium: Principal | ICD-10-CM

## 2017-10-29 DIAGNOSIS — O99891 Other specified diseases and conditions complicating pregnancy: Secondary | ICD-10-CM

## 2017-10-30 LAB — CYTOLOGY - PAP
Chlamydia: POSITIVE — AB
Diagnosis: NEGATIVE
Neisseria Gonorrhea: NEGATIVE

## 2017-10-31 ENCOUNTER — Encounter: Payer: Self-pay | Admitting: *Deleted

## 2017-10-31 ENCOUNTER — Other Ambulatory Visit: Payer: Self-pay

## 2017-10-31 ENCOUNTER — Telehealth: Payer: Self-pay

## 2017-10-31 DIAGNOSIS — A749 Chlamydial infection, unspecified: Secondary | ICD-10-CM

## 2017-10-31 DIAGNOSIS — O98819 Other maternal infectious and parasitic diseases complicating pregnancy, unspecified trimester: Principal | ICD-10-CM

## 2017-10-31 MED ORDER — AZITHROMYCIN 250 MG PO TABS: 1000.0000 mg | ORAL_TABLET | Freq: Once | ORAL | 0 refills | Status: AC

## 2017-10-31 NOTE — Telephone Encounter (Signed)
Called Pt to advise that she tested Positive for Chlamydia & that Zithromax was sent to her pharmacy. Also advised pt to contact partner to get treated & no sex until after 10 days of treatment for both. Pt verbalized understanding.STD Card filled out.

## 2017-11-01 ENCOUNTER — Encounter (HOSPITAL_COMMUNITY): Payer: Self-pay

## 2017-11-01 ENCOUNTER — Other Ambulatory Visit: Payer: Self-pay | Admitting: Student

## 2017-11-01 DIAGNOSIS — A568 Sexually transmitted chlamydial infection of other sites: Secondary | ICD-10-CM

## 2017-11-01 DIAGNOSIS — O98312 Other infections with a predominantly sexual mode of transmission complicating pregnancy, second trimester: Secondary | ICD-10-CM

## 2017-11-01 DIAGNOSIS — O98319 Other infections with a predominantly sexual mode of transmission complicating pregnancy, unspecified trimester: Secondary | ICD-10-CM | POA: Insufficient documentation

## 2017-11-01 MED ORDER — AZITHROMYCIN 250 MG PO TABS: 1000.0000 mg | ORAL_TABLET | Freq: Once | ORAL | 0 refills | Status: AC

## 2017-11-06 ENCOUNTER — Telehealth: Payer: Self-pay | Admitting: Student

## 2017-11-06 ENCOUNTER — Other Ambulatory Visit: Payer: Self-pay | Admitting: Student

## 2017-11-06 DIAGNOSIS — D649 Anemia, unspecified: Secondary | ICD-10-CM

## 2017-11-06 DIAGNOSIS — R898 Other abnormal findings in specimens from other organs, systems and tissues: Secondary | ICD-10-CM

## 2017-11-06 DIAGNOSIS — R772 Abnormality of alphafetoprotein: Secondary | ICD-10-CM

## 2017-11-06 DIAGNOSIS — O280 Abnormal hematological finding on antenatal screening of mother: Secondary | ICD-10-CM

## 2017-11-06 DIAGNOSIS — Z3480 Encounter for supervision of other normal pregnancy, unspecified trimester: Secondary | ICD-10-CM

## 2017-11-06 DIAGNOSIS — D508 Other iron deficiency anemias: Secondary | ICD-10-CM

## 2017-11-06 HISTORY — DX: Anemia, unspecified: D64.9

## 2017-11-06 HISTORY — DX: Other abnormal findings in specimens from other organs, systems and tissues: R89.8

## 2017-11-06 HISTORY — DX: Abnormal hematological finding on antenatal screening of mother: O28.0

## 2017-11-06 LAB — OBSTETRIC PANEL, INCLUDING HIV
Antibody Screen: NEGATIVE
Basophils Absolute: 0 10*3/uL (ref 0.0–0.2)
Basos: 0 %
EOS (ABSOLUTE): 0.1 10*3/uL (ref 0.0–0.4)
Eos: 1 %
HIV Screen 4th Generation wRfx: NONREACTIVE
Hematocrit: 34.5 % (ref 34.0–46.6)
Hemoglobin: 11.3 g/dL (ref 11.1–15.9)
Hepatitis B Surface Ag: NEGATIVE
Immature Grans (Abs): 0 10*3/uL (ref 0.0–0.1)
Immature Granulocytes: 0 %
Lymphocytes Absolute: 2.2 10*3/uL (ref 0.7–3.1)
Lymphs: 18 %
MCH: 27.6 pg (ref 26.6–33.0)
MCHC: 32.8 g/dL (ref 31.5–35.7)
MCV: 84 fL (ref 79–97)
Monocytes Absolute: 0.4 10*3/uL (ref 0.1–0.9)
Monocytes: 3 %
Neutrophils Absolute: 9.3 10*3/uL — ABNORMAL HIGH (ref 1.4–7.0)
Neutrophils: 78 %
Platelets: 299 10*3/uL (ref 150–450)
RBC: 4.1 x10E6/uL (ref 3.77–5.28)
RDW: 13.2 % (ref 12.3–15.4)
RPR Ser Ql: NONREACTIVE
Rh Factor: POSITIVE
Rubella Antibodies, IGG: 1.97 index (ref >0.99)
WBC: 11.9 10*3/uL — ABNORMAL HIGH (ref 3.4–10.8)

## 2017-11-06 LAB — HEMOGLOBINOPATHY EVALUATION
Ferritin: 9 ng/mL — ABNORMAL LOW (ref 15–150)
Hgb A2 Quant: 2.5 % (ref 1.8–3.2)
Hgb A: 97.5 % (ref 96.4–98.8)
Hgb C: 0 %
Hgb F Quant: 0 % (ref 0.0–2.0)
Hgb S: 0 %
Hgb Solubility: NEGATIVE
Hgb Variant: 0 %

## 2017-11-06 LAB — AFP, SERUM, OPEN SPINA BIFIDA
AFP MoM: 2.73
AFP Value: 122.4 ng/mL
Gest. Age on Collection Date: 17.6 weeks
Maternal Age At EDD: 23.3 yr
OSBR Risk 1 IN: 188
Test Results:: POSITIVE — AB
Weight: 127 [lb_av]

## 2017-11-06 LAB — CYSTIC FIBROSIS GENE TEST

## 2017-11-06 LAB — SMN1 COPY NUMBER ANALYSIS (SMA CARRIER SCREENING)

## 2017-11-06 MED ORDER — FERROUS FUMARATE 324 (106 FE) MG PO TABS: 1.0000 | ORAL_TABLET | Freq: Two times a day (BID) | ORAL | 1 refills | Status: DC

## 2017-11-06 NOTE — Telephone Encounter (Signed)
Spoke with patient about her MSAFP and SMA results. Explained that it was still too early to tell for sure if her baby will be affected, but that we recommend genetic testing for her. She will keep her US appt on Friday; informed her that MFM plans to schedule her for genetic testing next week (no availability on Friday). Patient amenable to plan of care; all questions answered.

## 2017-11-09 ENCOUNTER — Encounter (HOSPITAL_COMMUNITY): Payer: Self-pay

## 2017-11-09 ENCOUNTER — Other Ambulatory Visit: Payer: Self-pay | Admitting: Student

## 2017-11-09 ENCOUNTER — Ambulatory Visit (HOSPITAL_COMMUNITY)
Admission: RE | Admit: 2017-11-09 | Discharge: 2017-11-09 | Disposition: A | Discharge status: Home / Self Care | Payer: Self-pay | Source: Ambulatory Visit | Attending: Student | Admitting: Student

## 2017-11-09 DIAGNOSIS — Z363 Encounter for antenatal screening for malformations: Secondary | ICD-10-CM

## 2017-11-09 DIAGNOSIS — O281 Abnormal biochemical finding on antenatal screening of mother: Secondary | ICD-10-CM | POA: Insufficient documentation

## 2017-11-09 DIAGNOSIS — O4692 Antepartum hemorrhage, unspecified, second trimester: Secondary | ICD-10-CM

## 2017-11-09 DIAGNOSIS — R772 Abnormality of alphafetoprotein: Secondary | ICD-10-CM | POA: Insufficient documentation

## 2017-11-09 DIAGNOSIS — Z3A20 20 weeks gestation of pregnancy: Secondary | ICD-10-CM

## 2017-11-09 DIAGNOSIS — O289 Unspecified abnormal findings on antenatal screening of mother: Secondary | ICD-10-CM

## 2017-11-10 ENCOUNTER — Other Ambulatory Visit: Payer: Self-pay | Admitting: Student

## 2017-11-10 DIAGNOSIS — O280 Abnormal hematological finding on antenatal screening of mother: Secondary | ICD-10-CM

## 2017-11-16 ENCOUNTER — Other Ambulatory Visit: Payer: Self-pay

## 2017-11-21 MED ORDER — ONDANSETRON HCL 8 MG PO TABS: 8.0000 mg | ORAL_TABLET | Freq: Three times a day (TID) | ORAL | 0 refills | Status: DC | PRN

## 2017-11-26 ENCOUNTER — Ambulatory Visit (INDEPENDENT_AMBULATORY_CARE_PROVIDER_SITE_OTHER): Payer: Self-pay | Admitting: Advanced Practice Midwife

## 2017-11-26 ENCOUNTER — Other Ambulatory Visit (HOSPITAL_COMMUNITY)
Admission: RE | Admit: 2017-11-26 | Discharge: 2017-11-26 | Disposition: A | Discharge status: Home / Self Care | Payer: Medicaid Other | Source: Ambulatory Visit | Attending: Advanced Practice Midwife | Admitting: Advanced Practice Midwife

## 2017-11-26 VITALS — BP 101/62 | HR 70 | Wt 130.9 lb

## 2017-11-26 DIAGNOSIS — Z3A22 22 weeks gestation of pregnancy: Secondary | ICD-10-CM | POA: Insufficient documentation

## 2017-11-26 DIAGNOSIS — Z3480 Encounter for supervision of other normal pregnancy, unspecified trimester: Secondary | ICD-10-CM

## 2017-11-26 DIAGNOSIS — Z3482 Encounter for supervision of other normal pregnancy, second trimester: Secondary | ICD-10-CM | POA: Insufficient documentation

## 2017-11-26 NOTE — Progress Notes (Signed)
   PRENATAL VISIT NOTE  Subjective:  Kathleen Cooke is a 22 y.o. G2P1001 at 4868w3d being seen today for ongoing prenatal care.  She is currently monitored for the following issues for this low-risk pregnancy and has Supervision of other normal pregnancy, antepartum; Chlamydia trachomatis infection in pregnancy; Abnormal genetic test; Abnormal MSAFP (maternal serum alpha-fetoprotein), elevated; and Anemia on their problem list.  Patient reports no complaints.  Contractions: Not present. Vag. Bleeding: None.  Movement: Present. Denies leaking of fluid.   The following portions of the patient's history were reviewed and updated as appropriate: allergies, current medications, past family history, past medical history, past social history, past surgical history and problem list. Problem list updated.  Objective:   Vitals:   11/26/17 1119  BP: 101/62  Pulse: 70  Weight: 130 lb 14.4 oz (59.4 kg)    Fetal Status: Fetal Heart Rate (bpm): 157 Fundal Height: 21 cm Movement: Present     General:  Alert, oriented and cooperative. Patient is in no acute distress.  Skin: Skin is warm and dry. No rash noted.   Cardiovascular: Normal heart rate noted  Respiratory: Normal respiratory effort, no problems with respiration noted  Abdomen: Soft, gravid, appropriate for gestational age.  Pain/Pressure: Present     Pelvic: Cervical exam deferred        Extremities: Normal range of motion.  Edema: None  Mental Status: Normal mood and affect. Normal behavior. Normal judgment and thought content.   Assessment and Plan:  Pregnancy: G2P1001 at 3368w3d  1. Supervision of other normal pregnancy, antepartum - CBC; Future - HIV antibody; Future - RPR; Future - Glucose Tolerance, 2 Hours w/1 Hour; Future - Cervicovaginal ancillary only (chlamydia TOC) - Offered patient genetic counseling appt today. She declines at this time. She knows this option is available if desired.   Preterm labor symptoms and general  obstetric precautions including but not limited to vaginal bleeding, contractions, leaking of fluid and fetal movement were reviewed in detail with the patient. Please refer to After Visit Summary for other counseling recommendations.  Return in about 4 weeks (around 12/24/2017). 28 week labs with GTT at next visit   Future Appointments  Date Time Provider Department Center  12/05/2017  8:45 AM [facility] US 5 [facility]    Thressa ShellerHeather Hogan, CNM

## 2017-11-27 LAB — CERVICOVAGINAL ANCILLARY ONLY
Chlamydia: NEGATIVE
Neisseria Gonorrhea: NEGATIVE

## 2017-12-05 ENCOUNTER — Ambulatory Visit (HOSPITAL_COMMUNITY)
Admission: RE | Admit: 2017-12-05 | Discharge: 2017-12-05 | Disposition: A | Discharge status: Home / Self Care | Payer: Medicaid Other | Source: Ambulatory Visit | Attending: Student | Admitting: Student

## 2017-12-05 DIAGNOSIS — O280 Abnormal hematological finding on antenatal screening of mother: Secondary | ICD-10-CM | POA: Diagnosis not present

## 2017-12-05 DIAGNOSIS — Z3A23 23 weeks gestation of pregnancy: Secondary | ICD-10-CM | POA: Insufficient documentation

## 2017-12-05 DIAGNOSIS — Z362 Encounter for other antenatal screening follow-up: Secondary | ICD-10-CM

## 2017-12-05 DIAGNOSIS — O289 Unspecified abnormal findings on antenatal screening of mother: Secondary | ICD-10-CM

## 2017-12-07 ENCOUNTER — Encounter: Payer: Self-pay | Admitting: *Deleted

## 2017-12-11 ENCOUNTER — Encounter: Payer: Self-pay | Admitting: *Deleted

## 2017-12-24 ENCOUNTER — Encounter: Payer: Self-pay | Admitting: Student

## 2017-12-24 ENCOUNTER — Other Ambulatory Visit: Payer: Medicaid Other

## 2017-12-24 ENCOUNTER — Ambulatory Visit (INDEPENDENT_AMBULATORY_CARE_PROVIDER_SITE_OTHER): Payer: Self-pay | Admitting: Student

## 2017-12-24 VITALS — BP 107/63 | HR 70 | Wt 136.1 lb

## 2017-12-24 DIAGNOSIS — Z23 Encounter for immunization: Secondary | ICD-10-CM

## 2017-12-24 DIAGNOSIS — Z3480 Encounter for supervision of other normal pregnancy, unspecified trimester: Secondary | ICD-10-CM

## 2017-12-24 DIAGNOSIS — R898 Other abnormal findings in specimens from other organs, systems and tissues: Secondary | ICD-10-CM

## 2017-12-24 DIAGNOSIS — O280 Abnormal hematological finding on antenatal screening of mother: Secondary | ICD-10-CM

## 2017-12-24 DIAGNOSIS — O98312 Other infections with a predominantly sexual mode of transmission complicating pregnancy, second trimester: Secondary | ICD-10-CM

## 2017-12-24 DIAGNOSIS — D508 Other iron deficiency anemias: Secondary | ICD-10-CM

## 2017-12-24 DIAGNOSIS — A568 Sexually transmitted chlamydial infection of other sites: Secondary | ICD-10-CM

## 2017-12-24 DIAGNOSIS — O98812 Other maternal infectious and parasitic diseases complicating pregnancy, second trimester: Secondary | ICD-10-CM

## 2017-12-24 DIAGNOSIS — Z3482 Encounter for supervision of other normal pregnancy, second trimester: Secondary | ICD-10-CM

## 2017-12-24 DIAGNOSIS — A749 Chlamydial infection, unspecified: Secondary | ICD-10-CM

## 2017-12-24 NOTE — Progress Notes (Addendum)
   PRENATAL VISIT NOTE  Subjective:  Kathleen Cooke is a 22 y.o. G2P1001 at 4658w3d being seen today for ongoing prenatal care.  She is currently monitored for the following issues for this low-risk pregnancy and has Supervision of other normal pregnancy, antepartum; Chlamydia trachomatis infection in pregnancy; Abnormal genetic test; Abnormal MSAFP (maternal serum alpha-fetoprotein), elevated; and Anemia on their problem list.  Patient reports nausea.  Contractions: Not present. Vag. Bleeding: None.  Movement: Present. Denies leaking of fluid.   The following portions of the patient's history were reviewed and updated as appropriate: allergies, current medications, past family history, past medical history, past social history, past surgical history and problem list. Problem list updated.  Objective:   Vitals:   12/24/17 0843  BP: 107/63  Pulse: 70  Weight: 136 lb 1.6 oz (61.7 kg)    Fetal Status: Fetal Heart Rate (bpm): 152 Fundal Height: 26 cm Movement: Present     General:  Alert, oriented and cooperative. Patient is in no acute distress.  Skin: Skin is warm and dry. No rash noted.   Cardiovascular: Normal heart rate noted  Respiratory: Normal respiratory effort, no problems with respiration noted  Abdomen: Soft, gravid, appropriate for gestational age.  Pain/Pressure: Present     Pelvic: Cervical exam deferred        Extremities: Normal range of motion.  Edema: None  Mental Status: Normal mood and affect. Normal behavior. Normal judgment and thought content.   Assessment and Plan:  Pregnancy: G2P1001 at 5058w3d  1. Chlamydia trachomatis infection in mother during second trimester of pregnancy -Negative test of cure  2. Need for Tdap vaccination - Tdap vaccine greater than or equal to 7yo IM  3. Supervision of other normal pregnancy, antepartum - Flu Vaccine QUAD 36+ mos IM - US MFM OB FOLLOW UP; Future - OTC B6 10-25mg  and doxylamine night tablets for nausea   4.  Abnormal genetic test -Genetic testing was offered at visit today. Patient denies further genetic testing. She states she feels comfortable with further monitoring of pregnancy.   5. Abnormal MSAFP (maternal serum alpha-fetoprotein), elevated -US follow-up a MFM  6. Other iron deficiency anemia   Preterm labor symptoms and general obstetric precautions including but not limited to vaginal bleeding, contractions, leaking of fluid and fetal movement were reviewed in detail with the patient. Please refer to After Visit Summary for other counseling recommendations.  Return in about 2 weeks (around 01/07/2018).  Future Appointments  Date Time Provider Department Center  01/08/2018  9:00 AM [facility] US 3 [facility]    Marylene LandKathryn Lorraine Kooistra, CNM

## 2017-12-24 NOTE — Progress Notes (Signed)
m °

## 2017-12-24 NOTE — Patient Instructions (Signed)
There are also treatments and supplements that may help when you're feeling nauseated and you just don't have time to rest. Morning sickness can take a toll on family and work time, and sometimes soda crackers and other nonmedicine remedies just aren't cutting it. Taking vitamin B-6 can be an effective measure for improving symptoms of nausea, but it may not do much for reducing vomiting. AAFP notes that the recommendation is 10 to 25 milligrams every eight hours, but side effects can include headaches, fatigue, and paresthesia, or the sensation of "pins and needles."  A combination therapy of both vitamin B-6 and doxylamine, which is sold over the counter as [employer] SleepTabs, has been recommended by the [employer] for treating morning sickness in the first trimester. Take 10 to 25 mg of vitamin B-6 three times a day, every six to eight hours. Take 25 mg of Unisom SleepTabs once before bed.   There are other dosing recommendations that vary depending on the personal circumstances and symptoms of a woman's morning sickness, so speak with your doctor or midwife before taking any medications.  Note: In Unisom SleepGels and some other Unisom formulations, the active ingredient is diphenhydramine (not doxylamine). Double check the active ingredients to be sure.

## 2017-12-25 LAB — CBC
Hematocrit: 30.7 % — ABNORMAL LOW (ref 34.0–46.6)
Hemoglobin: 9.8 g/dL — ABNORMAL LOW (ref 11.1–15.9)
MCH: 26.3 pg — ABNORMAL LOW (ref 26.6–33.0)
MCHC: 31.9 g/dL (ref 31.5–35.7)
MCV: 82 fL (ref 79–97)
Platelets: 336 10*3/uL (ref 150–450)
RBC: 3.73 x10E6/uL — ABNORMAL LOW (ref 3.77–5.28)
RDW: 13.2 % (ref 12.3–15.4)
WBC: 9.8 10*3/uL (ref 3.4–10.8)

## 2017-12-25 LAB — GLUCOSE TOLERANCE, 2 HOURS W/ 1HR
Glucose, 1 hour: 74 mg/dL (ref 65–179)
Glucose, 2 hour: 81 mg/dL (ref 65–152)
Glucose, Fasting: 77 mg/dL (ref 65–91)

## 2017-12-25 LAB — HIV ANTIBODY (ROUTINE TESTING W REFLEX): HIV Screen 4th Generation wRfx: NONREACTIVE

## 2017-12-25 LAB — RPR: RPR Ser Ql: NONREACTIVE

## 2017-12-26 MED ORDER — FERROUS GLUCONATE 324 (38 FE) MG PO TABS: 324.0000 mg | ORAL_TABLET | Freq: Two times a day (BID) | ORAL | 3 refills | Status: DC

## 2017-12-26 NOTE — Addendum Note (Signed)
Addended by: Thressa Sheller D on: 12/26/2017 10:06 AM   Modules accepted: Orders

## 2018-01-08 ENCOUNTER — Ambulatory Visit (HOSPITAL_COMMUNITY)
Admission: RE | Admit: 2018-01-08 | Discharge: 2018-01-08 | Disposition: A | Discharge status: Home / Self Care | Payer: Medicaid Other | Source: Ambulatory Visit | Attending: Student | Admitting: Student

## 2018-01-08 ENCOUNTER — Encounter (HOSPITAL_COMMUNITY): Payer: Self-pay

## 2018-01-08 ENCOUNTER — Encounter: Payer: Medicaid Other | Admitting: Student

## 2018-01-08 DIAGNOSIS — Z362 Encounter for other antenatal screening follow-up: Secondary | ICD-10-CM

## 2018-01-08 DIAGNOSIS — Z3A28 28 weeks gestation of pregnancy: Secondary | ICD-10-CM | POA: Insufficient documentation

## 2018-01-08 DIAGNOSIS — Z3480 Encounter for supervision of other normal pregnancy, unspecified trimester: Secondary | ICD-10-CM

## 2018-01-08 DIAGNOSIS — O289 Unspecified abnormal findings on antenatal screening of mother: Secondary | ICD-10-CM | POA: Diagnosis not present

## 2018-01-08 DIAGNOSIS — Z3483 Encounter for supervision of other normal pregnancy, third trimester: Secondary | ICD-10-CM | POA: Insufficient documentation

## 2018-01-15 ENCOUNTER — Telehealth: Payer: Self-pay | Admitting: Obstetrics and Gynecology

## 2018-01-15 NOTE — Telephone Encounter (Signed)
Patient stated she just started a new job, and that's why she had not come to her appointment. She stated she would come tomorrow, she would just need to let her manager know.

## 2018-01-16 ENCOUNTER — Ambulatory Visit (INDEPENDENT_AMBULATORY_CARE_PROVIDER_SITE_OTHER): Payer: Medicaid Other | Admitting: Obstetrics and Gynecology

## 2018-01-16 ENCOUNTER — Encounter: Payer: Self-pay | Admitting: Obstetrics and Gynecology

## 2018-01-16 VITALS — BP 97/50 | HR 66 | Wt 138.2 lb

## 2018-01-16 DIAGNOSIS — Z3480 Encounter for supervision of other normal pregnancy, unspecified trimester: Secondary | ICD-10-CM

## 2018-01-16 DIAGNOSIS — O280 Abnormal hematological finding on antenatal screening of mother: Secondary | ICD-10-CM

## 2018-01-16 DIAGNOSIS — O98313 Other infections with a predominantly sexual mode of transmission complicating pregnancy, third trimester: Secondary | ICD-10-CM

## 2018-01-16 DIAGNOSIS — R898 Other abnormal findings in specimens from other organs, systems and tissues: Secondary | ICD-10-CM

## 2018-01-16 DIAGNOSIS — J4520 Mild intermittent asthma, uncomplicated: Secondary | ICD-10-CM

## 2018-01-16 DIAGNOSIS — A568 Sexually transmitted chlamydial infection of other sites: Secondary | ICD-10-CM

## 2018-01-16 DIAGNOSIS — J45909 Unspecified asthma, uncomplicated: Secondary | ICD-10-CM | POA: Insufficient documentation

## 2018-01-16 MED ORDER — ALBUTEROL SULFATE HFA 108 (90 BASE) MCG/ACT IN AERS: 2.0000 | INHALATION_SPRAY | Freq: Four times a day (QID) | RESPIRATORY_TRACT | 2 refills | Status: DC | PRN

## 2018-01-16 NOTE — Addendum Note (Signed)
Addended by: Leroy Libman on: 01/16/2018 03:24 PM   Modules accepted: Orders

## 2018-01-16 NOTE — Progress Notes (Signed)
   PRENATAL VISIT NOTE  Subjective:  Kathleen Cooke is a 22 y.o. G2P1001 at [redacted]w[redacted]d being seen today for ongoing prenatal care.  She is currently monitored for the following issues for this high-risk pregnancy and has Supervision of other normal pregnancy, antepartum; Chlamydia trachomatis infection in pregnancy; Abnormal genetic test; Abnormal MSAFP (maternal serum alpha-fetoprotein), elevated; Anemia; and Asthma on their problem list.  Patient reports no complaints.  Contractions: Not present. Vag. Bleeding: None.  Movement: Present. Denies leaking of fluid.   The following portions of the patient's history were reviewed and updated as appropriate: allergies, current medications, past family history, past medical history, past social history, past surgical history and problem list. Problem list updated.  Objective:   Vitals:   01/16/18 1416  BP: (!) 97/50  Pulse: 66  Weight: 138 lb 3.2 oz (62.7 kg)    Fetal Status: Fetal Heart Rate (bpm): 149   Movement: Present     General:  Alert, oriented and cooperative. Patient is in no acute distress.  Skin: Skin is warm and dry. No rash noted.   Cardiovascular: Normal heart rate noted  Respiratory: Normal respiratory effort, no problems with respiration noted  Abdomen: Soft, gravid, appropriate for gestational age.  Pain/Pressure: Present     Pelvic: Cervical exam deferred        Extremities: Normal range of motion.  Edema: None  Mental Status: Normal mood and affect. Normal behavior. Normal judgment and thought content.   Assessment and Plan:  Pregnancy: G2P1001 at [redacted]w[redacted]d  1. Supervision of other normal pregnancy, antepartum Depo for contraception  2. Chlamydia trachomatis infection in mother during third trimester of pregnancy TOC neg  3. Abnormal MSAFP (maternal serum alpha-fetoprotein), elevated Elevated MSAFP Declined genetic counseling Reviewed recommendation for genetic counseling today, patient declines Reviewed  recommendation for weekly testing starting 32 weeks, patient agreeable  4. 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 Mild intermittent asthma without complication Has not used inhaler in a while but requesting prescription as her asthma is worse in winter Albuterol inhaler sent to pharmacy Reviewed that if her asthma worsens, will need to see primary care   Preterm labor symptoms and general obstetric precautions including but not limited to vaginal bleeding, contractions, leaking of fluid and fetal movement were reviewed in detail with the patient. Please refer to After Visit Summary for other counseling recommendations.  Return in about 2 weeks (around 01/30/2018) for OB visit (MD).  Future Appointments  Date Time Provider Department Center  01/31/2018  2:15 PM Reva Bores, MD [facility]    Conan Bowens, MD

## 2018-01-16 NOTE — Patient Instructions (Signed)
AREA PEDIATRIC/FAMILY PRACTICE PHYSICIANS  [facility] [address] [place], [place]  [address] Phone - [phone]   Fax - [phone]  [facility] [address] [place], [place] [address] Phone - [phone]   Fax - [phone]  JACK AMOS [address] [place], [place]  [address] Phone - [phone]   Fax - [phone]  [facility] [address] [place], [place]  [address] Phone - [phone]   Fax - [phone]  [facility] [address] [place], [place]  [address] Phone - [phone]   Fax - [phone]  [facility] [address] [place], [place]  [address] Phone - [phone]   Fax - [phone]  [facility] [address] [place], [place]  [address] Phone - [phone]   Fax - [phone]  [facility] AT [facility] [address] [place], [address] [place], [place]  [address] Phone - [phone]   Fax - [phone]  [facility] AT [facility] [address] [place], [place]  [address] Phone - [phone]   Fax - [phone] [facility] [place] [address] [place], [place]  [address] Phone - [phone]   Fax - [phone]  [facility] [address] [place], [place]  [address] Phone - [phone]   Fax - [phone]  [facility] [address], [address] [place], [place]  [address] Phone - [phone]   Fax - [phone]  [facility] [place] [address] [place], [place]  [address] Phone - [phone]   Fax - [phone]  [facility] [address] [place], [place]  [address] Phone - [phone]  [facility] [address] [place], [place]  [address] Phone - [phone]   Fax - [phone]  [facility] [address] [place], [place]  [address] Phone - [phone]   Fax - [phone]  [facility] [address] [place], [place]  [address] Phone - [phone]   Fax - [phone]  [facility] [address] [place], [place]  [address] Phone - [phone]   Fax - [phone]   [facility] [address] [place], [place]  [address] Phone - [phone]   Fax - [phone]  [facility] [address] [place], [place]  [address] Phone - [phone]   Fax - [phone]  DAVID RUBIN [address] [place], [place]  [address] Phone - [phone]   Fax - [phone]  [facility] [address] [place], [place]  [address] Phone - [phone]   Fax - [phone]  [facility] [address] [place], [place]  [address] Phone - [phone]   Fax - [phone] Gerarda Fraction [address] [place], [place]  [address] Phone - [phone]   Fax - [phone]  [facility] [address] [place], [place]  [address] Phone - [phone]   Fax - [phone]  [facility] - [place] [address] [place], [place]  [address] Phone - [phone]   Fax - [phone]  [facility] [place] Wyvonne Lenz MD [address] [place] [address] Phone [phone]  Fax [phone]  Nursing Staff Provider  Office Location   Dating    Language  Anatomy US    Flu Vaccine   Genetic Screen  NIPS:   AFP:   First Screen:  Quad:    TDaP vaccine    Hgb A1C or  GTT Early  Third trimester   Rhogam     LAB RESULTS   Feeding Plan  Blood Type O/Positive/-- (07/25 1113)   Contraception  Antibody Negative (07/25 1113)  Circumcision  Rubella 1.97 (07/25 1113)  Pediatrician   RPR Non Reactive (09/23 0839)   Support Person  HBsAg Negative (07/25 1113)   Prenatal Classes  HIV  Non Reactive (09/23 0839)  BTL Consent  GBS  (For PCN allergy, check sensitivities)   VBAC Consent  Pap     Hgb Electro      CF     SMA     Waterbirth  [ ]  Class [ ]  Consent [ ]  CNM visit  .cwhprenatal

## 2018-01-17 ENCOUNTER — Other Ambulatory Visit: Payer: Self-pay | Admitting: Advanced Practice Midwife

## 2018-01-22 ENCOUNTER — Telehealth: Payer: Self-pay | Admitting: Family Medicine

## 2018-01-22 NOTE — Telephone Encounter (Signed)
Called patient to inform her of an appointment change due to the nurse not being available in the afternoon. I had to leave a voicemail.

## 2018-01-31 ENCOUNTER — Other Ambulatory Visit: Payer: Self-pay | Admitting: Advanced Practice Midwife

## 2018-01-31 ENCOUNTER — Ambulatory Visit (INDEPENDENT_AMBULATORY_CARE_PROVIDER_SITE_OTHER): Payer: Medicaid Other | Admitting: Family Medicine

## 2018-01-31 VITALS — BP 114/68 | HR 79 | Wt 141.3 lb

## 2018-01-31 DIAGNOSIS — Z3480 Encounter for supervision of other normal pregnancy, unspecified trimester: Secondary | ICD-10-CM

## 2018-01-31 DIAGNOSIS — Z3483 Encounter for supervision of other normal pregnancy, third trimester: Secondary | ICD-10-CM

## 2018-01-31 DIAGNOSIS — O280 Abnormal hematological finding on antenatal screening of mother: Secondary | ICD-10-CM

## 2018-01-31 NOTE — Patient Instructions (Signed)

## 2018-01-31 NOTE — Progress Notes (Signed)
   PRENATAL VISIT NOTE  Subjective:  Kathleen Cooke is a 22 y.o. G2P1001 at [redacted]w[redacted]d being seen today for ongoing prenatal care.  She is currently monitored for the following issues for this low-risk pregnancy and has Supervision of other normal pregnancy, antepartum; Chlamydia trachomatis infection in pregnancy; Abnormal genetic test; Abnormal MSAFP (maternal serum alpha-fetoprotein), elevated; Anemia; and Asthma on their problem list.  Patient reports no complaints.  Contractions: Not present. Vag. Bleeding: None.  Movement: Present. Denies leaking of fluid.   The following portions of the patient's history were reviewed and updated as appropriate: allergies, current medications, past family history, past medical history, past social history, past surgical history and problem list. Problem list updated.  Objective:   Vitals:   01/31/18 1435  BP: 114/68  Pulse: 79  Weight: 141 lb 4.8 oz (64.1 kg)    Fetal Status: Fetal Heart Rate (bpm): 150   Movement: Present     General:  Alert, oriented and cooperative. Patient is in no acute distress.  Skin: Skin is warm and dry. No rash noted.   Cardiovascular: Normal heart rate noted  Respiratory: Normal respiratory effort, no problems with respiration noted  Abdomen: Soft, gravid, appropriate for gestational age.  Pain/Pressure: Absent     Pelvic: Cervical exam deferred        Extremities: Normal range of motion.  Edema: None  Mental Status: Normal mood and affect. Normal behavior. Normal judgment and thought content.   Assessment and Plan:  Pregnancy: G2P1001 at [redacted]w[redacted]d  1. Supervision of other normal pregnancy, antepartum Continue routine prenatal care.  2. Abnormal MSAFP (maternal serum alpha-fetoprotein), elevated Per MFM serial u/s for growth  Preterm labor symptoms and general obstetric precautions including but not limited to vaginal bleeding, contractions, leaking of fluid and fetal movement were reviewed in detail with the  patient. Please refer to After Visit Summary for other counseling recommendations.  Return in 2 weeks (on 02/14/2018).  Future Appointments  Date Time Provider Department Center  02/07/2018 11:15 AM WOC-WOCA NST WOC-WOCA WOC  02/07/2018  2:15 PM Arvilla Market, DO WOC-WOCA WOC  02/07/2018  3:45 PM WH-MFC [place] 5 WH-MFCUS MFC-US  02/14/2018  2:15 PM Reva Bores, MD WOC-WOCA WOC  02/14/2018  3:15 PM WOC-WOCA NST WOC-WOCA WOC  02/21/2018  2:15 PM Hermina Staggers, MD WOC-WOCA WOC  02/21/2018  3:15 PM WOC-WOCA NST WOC-WOCA WOC    Reva Bores, MD

## 2018-02-06 ENCOUNTER — Ambulatory Visit (HOSPITAL_COMMUNITY): Payer: Medicaid Other

## 2018-02-06 ENCOUNTER — Other Ambulatory Visit: Payer: Self-pay | Admitting: *Deleted

## 2018-02-06 DIAGNOSIS — O280 Abnormal hematological finding on antenatal screening of mother: Secondary | ICD-10-CM

## 2018-02-06 NOTE — Progress Notes (Unsigned)
After consult with Dr. Shawnie Pons, I called pt and left message on her personal voice mail. I stated that she will not require the appt scheduled tomorrow @ 1115. She will have all the necessary testing for her baby during the ultrasound later that Auden Wettstein. She should arrive for the scheduled 2:15 appt, then will have ultrasound @ 3:45 as scheduled.

## 2018-02-07 ENCOUNTER — Ambulatory Visit (HOSPITAL_COMMUNITY): Admission: RE | Admit: 2018-02-07 | Payer: Medicaid Other | Source: Ambulatory Visit

## 2018-02-07 ENCOUNTER — Other Ambulatory Visit: Payer: Medicaid Other

## 2018-02-07 ENCOUNTER — Inpatient Hospital Stay (HOSPITAL_COMMUNITY)
Admission: AD | Admit: 2018-02-07 | Discharge: 2018-02-07 | Disposition: A | Discharge status: Home / Self Care | Payer: Medicaid Other | Source: Ambulatory Visit | Attending: Obstetrics and Gynecology | Admitting: Obstetrics and Gynecology

## 2018-02-07 ENCOUNTER — Other Ambulatory Visit: Payer: Self-pay

## 2018-02-07 ENCOUNTER — Ambulatory Visit: Payer: Medicaid Other | Admitting: Internal Medicine

## 2018-02-07 ENCOUNTER — Encounter (HOSPITAL_COMMUNITY): Payer: Self-pay

## 2018-02-07 VITALS — BP 113/76 | HR 110 | Wt 142.0 lb

## 2018-02-07 DIAGNOSIS — O99613 Diseases of the digestive system complicating pregnancy, third trimester: Secondary | ICD-10-CM | POA: Insufficient documentation

## 2018-02-07 DIAGNOSIS — Z3A32 32 weeks gestation of pregnancy: Secondary | ICD-10-CM | POA: Diagnosis not present

## 2018-02-07 DIAGNOSIS — R197 Diarrhea, unspecified: Secondary | ICD-10-CM

## 2018-02-07 DIAGNOSIS — Z3480 Encounter for supervision of other normal pregnancy, unspecified trimester: Secondary | ICD-10-CM

## 2018-02-07 DIAGNOSIS — K529 Noninfective gastroenteritis and colitis, unspecified: Secondary | ICD-10-CM | POA: Diagnosis not present

## 2018-02-07 DIAGNOSIS — O26899 Other specified pregnancy related conditions, unspecified trimester: Secondary | ICD-10-CM

## 2018-02-07 DIAGNOSIS — O219 Vomiting of pregnancy, unspecified: Secondary | ICD-10-CM

## 2018-02-07 DIAGNOSIS — O212 Late vomiting of pregnancy: Secondary | ICD-10-CM

## 2018-02-07 LAB — URINALYSIS, ROUTINE W REFLEX MICROSCOPIC
Bilirubin Urine: NEGATIVE
Glucose, UA: NEGATIVE mg/dL
Hgb urine dipstick: NEGATIVE
Ketones, ur: 80 mg/dL — AB
Nitrite: NEGATIVE
Protein, ur: 30 mg/dL — AB
Specific Gravity, Urine: 1.024 (ref 1.005–1.030)
pH: 5 (ref 5.0–8.0)

## 2018-02-07 LAB — COMPREHENSIVE METABOLIC PANEL
ALT: 64 U/L — ABNORMAL HIGH (ref 0–44)
AST: 64 U/L — ABNORMAL HIGH (ref 15–41)
Albumin: 2.5 g/dL — ABNORMAL LOW (ref 3.5–5.0)
Alkaline Phosphatase: 212 U/L — ABNORMAL HIGH (ref 38–126)
Anion gap: 12 (ref 5–15)
BUN: 6 mg/dL (ref 6–20)
CO2: 19 mmol/L — ABNORMAL LOW (ref 22–32)
Calcium: 8.1 mg/dL — ABNORMAL LOW (ref 8.9–10.3)
Chloride: 102 mmol/L (ref 98–111)
Creatinine, Ser: 0.38 mg/dL — ABNORMAL LOW (ref 0.44–1.00)
GFR calc Af Amer: >60 mL/min (ref >60)
GFR calc non Af Amer: >60 mL/min (ref >60)
Glucose, Bld: 66 mg/dL — ABNORMAL LOW (ref 70–99)
Potassium: 3.7 mmol/L (ref 3.5–5.1)
Sodium: 133 mmol/L — ABNORMAL LOW (ref 135–145)
Total Bilirubin: 0.9 mg/dL (ref 0.3–1.2)
Total Protein: 5.7 g/dL — ABNORMAL LOW (ref 6.5–8.1)

## 2018-02-07 MED ORDER — LACTATED RINGERS IV BOLUS
1000.0000 mL | Freq: Once | INTRAVENOUS | Status: AC
  Administered 2018-02-07: 1000 mL via INTRAVENOUS

## 2018-02-07 MED ORDER — M.V.I. ADULT IV INJ
Freq: Once | INTRAVENOUS | Status: AC
  Administered 2018-02-07: 18:00:00 via INTRAVENOUS
  Filled 2018-02-07: qty 10

## 2018-02-07 MED ORDER — SODIUM CHLORIDE 0.9 % IV SOLN
8.0000 mg | Freq: Once | INTRAVENOUS | Status: AC
  Administered 2018-02-07: 8 mg via INTRAVENOUS
  Filled 2018-02-07: qty 4

## 2018-02-07 MED ORDER — ONDANSETRON 4 MG PO TBDP: 4.0000 mg | ORAL_TABLET | Freq: Three times a day (TID) | ORAL | 0 refills | Status: DC | PRN

## 2018-02-07 NOTE — Progress Notes (Signed)
Kathleen Cooke is 22 y.o. G25P1001 female at [redacted]w[redacted]d who presents to [facility] for routine PNC. Reports that she has had persistent nausea/vomiting and diarrhea since midnight. She has thrown up several times and is unable to keep any fluids down. Son was recently sick with similar symptoms. Felt feverish last night but did not take her temperature. Endorses feeling weak and lightheaded today.   I assessed patient and she appears ill and pale. Temperature is 98.4. Has gained less than 1 lb in the past week. Lips and MM appear dry. Patient is mildly tachycardic.   Vitals:   02/07/18 1436  BP: 113/76  Pulse: (!) 110    Suspect patient has acute gastroenteritis. Given appearance, brief physical exam findings, vital signs and symptoms warrants more urgent care. RN to assist patient to MAU in wheelchair. Would recommend IV anti-emetics and consideration of IVF.   Marcy Siren, D.O. [facility] Family Medicine Fellow, [facility], [facility] 02/07/2018, 2:42 PM

## 2018-02-07 NOTE — MAU Provider Note (Signed)
History     CSN: [id number]  Arrival date and time: 02/07/18 1444   First Provider Initiated Contact with Patient 02/07/18 1632      Chief Complaint  Patient presents with  . Nausea  . Diarrhea   HPI Kathleen Cooke is a 22 y.o. G2P1 at [redacted]w[redacted]d who was sent up from office for dehydration. She was diagnosed with acute gastroenteritis. Reports nausea/vomiting and diarrhea since MN. Unable to keep fluid down since vomiting began, reports vomiting 5-6 times since MN. Recent sick contact- son was sick with similar symptoms. She reports symptoms is associated with weakness and lightheaded. She is unsure whether she had a fever last night- reports not taking her temperature.   OB History    Gravida  2   Para  1   Term  1   Preterm  0   AB  0   Living  1     SAB  0   TAB  0   Ectopic  0   Multiple  0   Live Births  1           Past Medical History:  Diagnosis Date  . Asthma   . Headache     Past Surgical History:  Procedure Laterality Date  . NO PAST SURGERIES      Family History  Problem Relation Age of Onset  . Birth defects Neg Hx   . Diabetes Neg Hx   . Early death Neg Hx   . Heart disease Neg Hx   . Hypertension Neg Hx   . Stroke Neg Hx   . Miscarriages / Stillbirths Neg Hx     Social History   Tobacco Use  . Smoking status: Never Smoker  . Smokeless tobacco: Never Used  Substance Use Topics  . Alcohol use: No  . Drug use: Not Currently    Types: Marijuana    Comment: used cocaine 1.5 yrs ago     Allergies: No Known Allergies  Medications Prior to Admission  Medication Sig Dispense Refill Last Dose  . albuterol (PROVENTIL HFA;VENTOLIN HFA) 108 (90 Base) MCG/ACT inhaler Inhale 2 puffs into the lungs every 6 (six) hours as needed for wheezing or shortness of breath. 1 Inhaler 2 Taking  . ferrous gluconate (FERGON) 324 MG tablet TAKE 1 TABLET (324 MG TOTAL) BY MOUTH 2 (TWO) TIMES DAILY WITH A MEAL. 180 tablet 1   . ondansetron (ZOFRAN) 8  MG tablet Take 1 tablet (8 mg total) by mouth every 8 (eight) hours as needed for nausea or vomiting. 60 tablet 0 Taking  . Prenatal Vit-Fe Fumarate-FA (PRENATAL VITAMIN PO) Take by mouth.   Taking    Review of Systems  Respiratory: Negative.   Cardiovascular: Negative.   Gastrointestinal: Positive for diarrhea, nausea and vomiting. Negative for abdominal pain and constipation.  Genitourinary: Negative.   Neurological: Positive for weakness and light-headedness. Negative for dizziness and headaches.   Physical Exam   Blood pressure 113/68, pulse (!) 102, temperature 98.5 F (36.9 C), resp. rate 16, weight 64 kg, last menstrual period 06/22/2017, SpO2 100 %.  Physical Exam  Constitutional: She is oriented to person, place, and time. She appears well-developed. No distress.  Ill appearance   Cardiovascular: Tachycardia present.  Respiratory: Effort normal and breath sounds normal. No respiratory distress. She has no wheezes.  GI: Soft. There is no tenderness. There is no rebound.  Gravid appropriate for gestational age  Neurological: She is alert and oriented to person, place, and time.  Skin: Skin is warm and dry. There is pallor.  Psychiatric: She has a normal mood and affect. Her behavior is normal. Thought content normal.   FHR: 150/ moderate/ +accels / no deceleration  Toco: UI  MAU Course  Procedures  MDM Orders Placed This Encounter  Procedures  . Urinalysis, Routine w reflex microscopic  . Comprehensive metabolic panel  . Insert peripheral IV   UA noted dehydration and positive ketones. IVF given with Zofran IV.   Meds ordered this encounter  Medications  . lactated ringers bolus 1,000 mL  . multivitamins adult (MVI -12) 10 mL in dextrose 5% lactated ringers 1,000 mL infusion  . ondansetron (ZOFRAN) 8 mg in sodium chloride 0.9 % 50 mL IVPB  . ondansetron (ZOFRAN ODT) 4 MG disintegrating tablet    Sig: Take 1 tablet (4 mg total) by mouth every 8 (eight) hours as  needed for nausea or vomiting.    Dispense:  30 tablet    Refill:  0    Order Specific Question:   Supervising Provider    Answer:   Conan Bowens [[id number]]   Treatments in MAU included zofran 8mg  IV, banana bag and LR IV bolus   Patient reports feeling better after medication treatment in MAU. Encouraged patient to stay away from other and perform good hand hygiene around child due to stomach flu being contagious. Pt stable at time of discharge.   Assessment and Plan   1. Gastroenteritis, acute   2. [redacted] weeks gestation of pregnancy   3. Nausea and vomiting in pregnancy   4. Diarrhea during pregnancy    Discharge home  Follow up as scheduled for prenatal appointments  Discussed reasons to return to MAU  Rx for Zofran sent to pharmacy of choice  Hydration and hand hygiene   Follow-up Information    Center for [facility] Follow up.   Specialty:  Obstetrics and Gynecology Why:  Follow up as scheduled for prenatal appointments  Contact information: [address] [place] [address] [phone]         Allergies as of 02/07/2018   No Known Allergies     Medication List    STOP taking these medications   ondansetron 8 MG tablet Commonly known as:  ZOFRAN     TAKE these medications   albuterol 108 (90 Base) MCG/ACT inhaler Commonly known as:  PROVENTIL HFA;VENTOLIN HFA Inhale 2 puffs into the lungs every 6 (six) hours as needed for wheezing or shortness of breath.   ferrous gluconate 324 MG tablet Commonly known as:  FERGON TAKE 1 TABLET (324 MG TOTAL) BY MOUTH 2 (TWO) TIMES DAILY WITH A MEAL.   ondansetron 4 MG disintegrating tablet Commonly known as:  ZOFRAN-ODT Take 1 tablet (4 mg total) by mouth every 8 (eight) hours as needed for nausea or vomiting.   PRENATAL VITAMIN PO Take by mouth.       Sharyon Cable CNM  02/07/2018, 8:12 PM

## 2018-02-07 NOTE — MAU Note (Addendum)
Pt states that since last night at midnight she couldn't sleep because she was vomiting and having diarrhea. This has continued throughout today.   Pt feels weak.

## 2018-02-14 ENCOUNTER — Ambulatory Visit (INDEPENDENT_AMBULATORY_CARE_PROVIDER_SITE_OTHER): Payer: Medicaid Other | Admitting: Family Medicine

## 2018-02-14 ENCOUNTER — Ambulatory Visit (INDEPENDENT_AMBULATORY_CARE_PROVIDER_SITE_OTHER): Payer: Medicaid Other | Admitting: *Deleted

## 2018-02-14 ENCOUNTER — Ambulatory Visit: Payer: Self-pay

## 2018-02-14 VITALS — BP 104/60 | HR 85 | Wt 141.0 lb

## 2018-02-14 DIAGNOSIS — O280 Abnormal hematological finding on antenatal screening of mother: Secondary | ICD-10-CM

## 2018-02-14 DIAGNOSIS — Z3A33 33 weeks gestation of pregnancy: Secondary | ICD-10-CM | POA: Diagnosis not present

## 2018-02-14 DIAGNOSIS — Z3480 Encounter for supervision of other normal pregnancy, unspecified trimester: Secondary | ICD-10-CM

## 2018-02-14 DIAGNOSIS — L299 Pruritus, unspecified: Secondary | ICD-10-CM

## 2018-02-14 DIAGNOSIS — Z3483 Encounter for supervision of other normal pregnancy, third trimester: Secondary | ICD-10-CM

## 2018-02-14 NOTE — Progress Notes (Signed)
    PRENATAL VISIT NOTE  Subjective:  Kathleen Cooke is a 22 y.o. G2P1001 at 8657w6d being seen today for ongoing prenatal care.  She is currently monitored for the following issues for this high-risk pregnancy and has Supervision of other normal pregnancy, antepartum; Chlamydia trachomatis infection in pregnancy; Abnormal genetic test; Abnormal MSAFP (maternal serum alpha-fetoprotein), elevated; Anemia; and Asthma on their problem list.  Patient reports itching x 2 wks, worse on palms and soles. No rash.  Contractions: Not present. Vag. Bleeding: None.  Movement: Present. Denies leaking of fluid.   The following portions of the patient's history were reviewed and updated as appropriate: allergies, current medications, past family history, past medical history, past social history, past surgical history and problem list. Problem list updated.  Objective:   Vitals:   02/14/18 1337  BP: 104/60  Pulse: 85  Weight: 141 lb (64 kg)    Fetal Status: Fetal Heart Rate (bpm): NST   Movement: Present     General:  Alert, oriented and cooperative. Patient is in no acute distress.  Skin: Skin is warm and dry. No rash noted.   Cardiovascular: Normal heart rate noted  Respiratory: Normal respiratory effort, no problems with respiration noted  Abdomen: Soft, gravid, appropriate for gestational age.  Pain/Pressure: Present     Pelvic: Cervical exam deferred        Extremities: Normal range of motion.     Mental Status: Normal mood and affect. Normal behavior. Normal judgment and thought content.  NST:  Baseline: 130 bpm, Variability: Good {> 6 bpm), Accelerations: Reactive and Decelerations: Absent   Assessment and Plan:  Pregnancy: G2P1001 at 6357w6d  1. Supervision of other normal pregnancy, antepartum   2. Abnormal MSAFP (maternal serum alpha-fetoprotein), elevated In weekly testing U/s for growth next visit  3. Itching Check bile acids, if positive, needs Actigall and IOL at 37 wks -  Bile acids, total    Preterm labor symptoms and general obstetric precautions including but not limited to vaginal bleeding, contractions, leaking of fluid and fetal movement were reviewed in detail with the patient. Please refer to After Visit Summary for other counseling recommendations.  Return in about 12 days (around 02/26/2018) for NST/BPP and HOB; 12/4 NST/BPP and HOB.  Future Appointments  Date Time Provider Department Center  02/21/2018  2:15 PM Hermina StaggersErvin, Michael L, MD WOC-WOCA WOC  02/21/2018  3:15 PM WOC-WOCA NST WOC-WOCA WOC  02/21/2018  3:45 PM WH-MFC US 2 WH-MFCUS MFC-US  02/26/2018  1:15 PM WOC-WOCA NST WOC-WOCA WOC  02/26/2018  2:15 PM Katrinka BlazingSmith, SharonVirginia, CNM WOC-WOCA WOC  03/06/2018  1:15 PM WOC-WOCA NST WOC-WOCA WOC  03/06/2018  2:15 PM Reva BoresPratt, Josph [place]fleet S, MD WOC-WOCA WOC    Reva Boresanya S Maddyson Keil, MD

## 2018-02-14 NOTE — Progress Notes (Signed)

## 2018-02-14 NOTE — Progress Notes (Signed)
Pt states she has been itching all over her body - particularly on palms of hands and feet for 1-2 weeks. The problem is worse @ night. Pt does not have any visible rash. US for growth and BPP scheduled @ MFM on 11/21.

## 2018-02-14 NOTE — Patient Instructions (Signed)

## 2018-02-15 LAB — BILE ACIDS, TOTAL: Bile Acids Total: 7.7 umol/L (ref 0.0–10.0)

## 2018-02-19 ENCOUNTER — Telehealth: Payer: Self-pay | Admitting: General Practice

## 2018-02-19 NOTE — Telephone Encounter (Signed)
Patient called into front office stating she is having itching all over and was waiting to hear back about a blood test to determine if there was something wrong with her gallbladder causing the itching. Informed patient of normal bile acid results & recommended she try taking benadryl or zyrtec or claritin. Patient verbalized understanding & had no questions.

## 2018-02-21 ENCOUNTER — Ambulatory Visit (HOSPITAL_COMMUNITY)
Admission: RE | Admit: 2018-02-21 | Discharge: 2018-02-21 | Disposition: A | Discharge status: Home / Self Care | Payer: Medicaid Other | Source: Ambulatory Visit | Attending: Student | Admitting: Student

## 2018-02-21 ENCOUNTER — Other Ambulatory Visit: Payer: Medicaid Other

## 2018-02-21 ENCOUNTER — Encounter (HOSPITAL_COMMUNITY): Payer: Self-pay

## 2018-02-21 ENCOUNTER — Ambulatory Visit (INDEPENDENT_AMBULATORY_CARE_PROVIDER_SITE_OTHER): Payer: Medicaid Other | Admitting: Obstetrics and Gynecology

## 2018-02-21 ENCOUNTER — Ambulatory Visit (INDEPENDENT_AMBULATORY_CARE_PROVIDER_SITE_OTHER): Payer: Medicaid Other | Admitting: *Deleted

## 2018-02-21 ENCOUNTER — Encounter: Payer: Self-pay | Admitting: Obstetrics and Gynecology

## 2018-02-21 VITALS — BP 109/62 | HR 82 | Wt 147.6 lb

## 2018-02-21 DIAGNOSIS — O280 Abnormal hematological finding on antenatal screening of mother: Secondary | ICD-10-CM

## 2018-02-21 DIAGNOSIS — R740 Nonspecific elevation of levels of transaminase and lactic acid dehydrogenase [LDH]: Secondary | ICD-10-CM

## 2018-02-21 DIAGNOSIS — R7401 Elevation of levels of liver transaminase levels: Secondary | ICD-10-CM

## 2018-02-21 DIAGNOSIS — J4520 Mild intermittent asthma, uncomplicated: Secondary | ICD-10-CM

## 2018-02-21 DIAGNOSIS — Z362 Encounter for other antenatal screening follow-up: Secondary | ICD-10-CM

## 2018-02-21 DIAGNOSIS — Z3A34 34 weeks gestation of pregnancy: Secondary | ICD-10-CM

## 2018-02-21 DIAGNOSIS — Z3480 Encounter for supervision of other normal pregnancy, unspecified trimester: Secondary | ICD-10-CM

## 2018-02-21 DIAGNOSIS — Z3483 Encounter for supervision of other normal pregnancy, third trimester: Secondary | ICD-10-CM

## 2018-02-21 DIAGNOSIS — O289 Unspecified abnormal findings on antenatal screening of mother: Secondary | ICD-10-CM | POA: Insufficient documentation

## 2018-02-21 HISTORY — DX: Elevation of levels of liver transaminase levels: R74.01

## 2018-02-21 MED ORDER — URSODIOL 500 MG PO TABS: 500.0000 mg | ORAL_TABLET | Freq: Two times a day (BID) | ORAL | 3 refills | Status: DC

## 2018-02-21 NOTE — Progress Notes (Signed)
Pt states is still having a lot of itching. Tried Benadryl but still itching.

## 2018-02-21 NOTE — Progress Notes (Signed)
Subjective:  Kathleen Cooke is a 22 y.o. G2P1001 at 50w6dbeing seen today for ongoing prenatal care.  She is currently monitored for the following issues for this high-risk pregnancy and has Supervision of other normal pregnancy, antepartum; Abnormal genetic test; Abnormal MSAFP (maternal serum alpha-fetoprotein), elevated; Anemia; Asthma; and Elevated transaminase level on their problem list.  Patient reports continues to itch.  Contractions: Not present. Vag. Bleeding: None.  Movement: Present. Denies leaking of fluid.   The following portions of the patient's history were reviewed and updated as appropriate: allergies, current medications, past family history, past medical history, past social history, past surgical history and problem list. Problem list updated.  Objective:   Vitals:   02/21/18 1446  BP: 109/62  Pulse: 82  Weight: 147 lb 9.6 oz (67 kg)    Fetal Status: Fetal Heart Rate (bpm): 140   Movement: Present     General:  Alert, oriented and cooperative. Patient is in no acute distress.  Skin: Skin is warm and dry. No rash noted.   Cardiovascular: Normal heart rate noted  Respiratory: Normal respiratory effort, no problems with respiration noted  Abdomen: Soft, gravid, appropriate for gestational age. Pain/Pressure: Present     Pelvic:  Cervical exam deferred        Extremities: Normal range of motion.  Edema: None  Mental Status: Normal mood and affect. Normal behavior. Normal judgment and thought content.   Urinalysis:      Assessment and Plan:  Pregnancy: G2P1001 at 331w6d1. Supervision of other normal pregnancy, antepartum Stable  2. Abnormal MSAFP (maternal serum alpha-fetoprotein), elevated Continue with antenatal testing BPP/NST today  3. Elevated transaminase level Biles acids normal a week ago but transaminases slightly elevated Concern for cholestasias of pregnancy. Will repeat labs plus check for hepatitis Start Actigall and consider IOL at 37  weeks once additional information obtained and response to Actigall - Comp Met (CMET) - CBC - Protein / creatinine ratio, urine - Bile acids, total - Hepatitis panel, acute  Preterm labor symptoms and general obstetric precautions including but not limited to vaginal bleeding, contractions, leaking of fluid and fetal movement were reviewed in detail with the patient. Please refer to After Visit Summary for other counseling recommendations.  Return in about 1 week (around 02/28/2018) for OB visit.   ErChancy MilroyMD

## 2018-02-22 LAB — HEPATITIS PANEL, ACUTE
Hep A IgM: NEGATIVE
Hep B C IgM: NEGATIVE
Hep C Virus Ab: <0.1 s/co ratio (ref 0.0–0.9)
Hepatitis B Surface Ag: NEGATIVE

## 2018-02-22 LAB — CBC
Hematocrit: 29.6 % — ABNORMAL LOW (ref 34.0–46.6)
Hemoglobin: 9.4 g/dL — ABNORMAL LOW (ref 11.1–15.9)
MCH: 24.5 pg — ABNORMAL LOW (ref 26.6–33.0)
MCHC: 31.8 g/dL (ref 31.5–35.7)
MCV: 77 fL — ABNORMAL LOW (ref 79–97)
Platelets: 350 10*3/uL (ref 150–450)
RBC: 3.84 x10E6/uL (ref 3.77–5.28)
RDW: 14.7 % (ref 12.3–15.4)
WBC: 10.2 10*3/uL (ref 3.4–10.8)

## 2018-02-22 LAB — COMPREHENSIVE METABOLIC PANEL
ALT: 77 IU/L — ABNORMAL HIGH (ref 0–32)
AST: 58 IU/L — ABNORMAL HIGH (ref 0–40)
Albumin/Globulin Ratio: 1 — ABNORMAL LOW (ref 1.2–2.2)
Albumin: 2.9 g/dL — ABNORMAL LOW (ref 3.5–5.5)
Alkaline Phosphatase: 275 IU/L — ABNORMAL HIGH (ref 39–117)
BUN/Creatinine Ratio: 10 (ref 9–23)
BUN: 5 mg/dL — ABNORMAL LOW (ref 6–20)
Bilirubin Total: 0.2 mg/dL (ref 0.0–1.2)
CO2: 18 mmol/L — ABNORMAL LOW (ref 20–29)
Calcium: 7.9 mg/dL — ABNORMAL LOW (ref 8.7–10.2)
Chloride: 106 mmol/L (ref 96–106)
Creatinine, Ser: 0.48 mg/dL — ABNORMAL LOW (ref 0.57–1.00)
GFR calc Af Amer: 160 mL/min/{1.73_m2} (ref >59)
GFR calc non Af Amer: 139 mL/min/{1.73_m2} (ref >59)
Globulin, Total: 3 g/dL (ref 1.5–4.5)
Glucose: 101 mg/dL — ABNORMAL HIGH (ref 65–99)
Potassium: 3.8 mmol/L (ref 3.5–5.2)
Sodium: 140 mmol/L (ref 134–144)
Total Protein: 5.9 g/dL — ABNORMAL LOW (ref 6.0–8.5)

## 2018-02-22 LAB — PROTEIN / CREATININE RATIO, URINE
Creatinine, Urine: 75 mg/dL
Protein, Ur: 14.5 mg/dL
Protein/Creat Ratio: 193 mg/g creat (ref 0–200)

## 2018-02-22 LAB — BILE ACIDS, TOTAL: Bile Acids Total: 19.8 umol/L (ref 0.0–10.0)

## 2018-02-25 ENCOUNTER — Encounter: Payer: Self-pay | Admitting: Obstetrics and Gynecology

## 2018-02-25 DIAGNOSIS — O26619 Liver and biliary tract disorders in pregnancy, unspecified trimester: Secondary | ICD-10-CM | POA: Insufficient documentation

## 2018-02-25 DIAGNOSIS — K831 Obstruction of bile duct: Secondary | ICD-10-CM

## 2018-02-25 DIAGNOSIS — O26649 Intrahepatic cholestasis of pregnancy, unspecified trimester: Secondary | ICD-10-CM

## 2018-02-25 HISTORY — DX: Liver and biliary tract disorders in pregnancy, unspecified trimester: O26.619

## 2018-02-25 HISTORY — DX: Intrahepatic cholestasis of pregnancy, unspecified trimester: O26.649

## 2018-02-25 HISTORY — DX: Obstruction of bile duct: K83.1

## 2018-02-26 ENCOUNTER — Telehealth (HOSPITAL_COMMUNITY): Payer: Self-pay | Admitting: *Deleted

## 2018-02-26 ENCOUNTER — Telehealth: Payer: Self-pay | Admitting: General Practice

## 2018-02-26 ENCOUNTER — Ambulatory Visit (INDEPENDENT_AMBULATORY_CARE_PROVIDER_SITE_OTHER): Payer: Medicaid Other | Admitting: Advanced Practice Midwife

## 2018-02-26 ENCOUNTER — Ambulatory Visit (INDEPENDENT_AMBULATORY_CARE_PROVIDER_SITE_OTHER): Payer: Medicaid Other | Admitting: *Deleted

## 2018-02-26 ENCOUNTER — Ambulatory Visit: Payer: Self-pay

## 2018-02-26 ENCOUNTER — Other Ambulatory Visit (HOSPITAL_COMMUNITY)
Admission: RE | Admit: 2018-02-26 | Discharge: 2018-02-26 | Disposition: A | Discharge status: Home / Self Care | Payer: Medicaid Other | Source: Ambulatory Visit | Attending: Advanced Practice Midwife | Admitting: Advanced Practice Midwife

## 2018-02-26 VITALS — BP 111/68 | HR 73 | Wt 145.9 lb

## 2018-02-26 DIAGNOSIS — K831 Obstruction of bile duct: Secondary | ICD-10-CM

## 2018-02-26 DIAGNOSIS — O280 Abnormal hematological finding on antenatal screening of mother: Secondary | ICD-10-CM

## 2018-02-26 DIAGNOSIS — O26613 Liver and biliary tract disorders in pregnancy, third trimester: Secondary | ICD-10-CM

## 2018-02-26 DIAGNOSIS — Z3A35 35 weeks gestation of pregnancy: Secondary | ICD-10-CM

## 2018-02-26 DIAGNOSIS — O26619 Liver and biliary tract disorders in pregnancy, unspecified trimester: Secondary | ICD-10-CM

## 2018-02-26 DIAGNOSIS — Z3480 Encounter for supervision of other normal pregnancy, unspecified trimester: Secondary | ICD-10-CM | POA: Diagnosis not present

## 2018-02-26 MED ORDER — DIPHENHYDRAMINE HCL 25 MG PO TABS: 25.0000 mg | ORAL_TABLET | Freq: Four times a day (QID) | ORAL | 1 refills | Status: DC | PRN

## 2018-02-26 NOTE — Telephone Encounter (Signed)
-----   Message from Hermina StaggersMichael L Ervin, MD sent at 02/25/2018 10:12 AM EST ----- Please let Ms Meda KlinefelterVillegas know that she has cholestasis of pregnancy. Will need to delivery at 37 weeks Thanks Casimiro NeedleMichael

## 2018-02-26 NOTE — Progress Notes (Signed)
Pt is aware of Dx: Cholestasis and plan for delivery @ 37 weeks. Pt states she is still having severe itching - started Actigall yesterday.

## 2018-02-26 NOTE — Telephone Encounter (Signed)
Preadmission screen  

## 2018-02-26 NOTE — Telephone Encounter (Signed)
Patient called into front office stating she couldn't make her appt today because of work. Informed patient of bile acid results indicating cholestasis. Discussed importance of taking medication and IOL at 37 weeks. Discussed importance of keeping appt today given new diagnosis and importance of antenatal testing given that information. Told patient we can provide note for her job if need be. Patient verbalized understanding to all & had no questions.

## 2018-02-26 NOTE — Progress Notes (Signed)
   PRENATAL VISIT NOTE  Subjective:  Kathleen Cooke is a 22 y.o. G2P1001 at 7665w4d being seen today for ongoing prenatal care.  She is currently monitored for the following issues for this high-risk pregnancy and has Supervision of other normal pregnancy, antepartum; Abnormal genetic test; Abnormal MSAFP (maternal serum alpha-fetoprotein), elevated; Anemia; Asthma; Elevated transaminase level; and Cholestasis during pregnancy on their problem list.  Patient reports itching.  Contractions: Not present. Vag. Bleeding: None.  Movement: Present. Denies leaking of fluid.   The following portions of the patient's history were reviewed and updated as appropriate: allergies, current medications, past family history, past medical history, past social history, past surgical history and problem list. Problem list updated.  Objective:   Vitals:   02/26/18 1334  BP: 111/68  Pulse: 73  Weight: 145 lb 14.4 oz (66.2 kg)    Fetal Status: Fetal Heart Rate (bpm): NST Fundal Height: 35 cm Movement: Present  Presentation: Vertex  General:  Alert, oriented and cooperative. Patient is in no acute distress.  Skin: Skin is warm and dry. No rash noted.   Cardiovascular: Normal heart rate noted  Respiratory: Normal respiratory effort, no problems with respiration noted  Abdomen: Soft, gravid, appropriate for gestational age.  Pain/Pressure: Present     Pelvic: Cervical exam performed Dilation: Fingertip Effacement (%): 0 Station: -3  Extremities: Normal range of motion.  Edema: None  Mental Status: Normal mood and affect. Normal behavior. Normal judgment and thought content.   Assessment and Plan:  Pregnancy: G2P1001 at 8265w4d  1. Supervision of other normal pregnancy, antepartum - GC/Chlamydia probe amp ([place] - Strep Gp B NAA  2. Abnormal MSAFP (maternal serum alpha-fetoprotein), elevated   3. Cholestasis during pregnancy, antepartum - IOl scheduled 37 weeks - Good candidate for  foley ripening   Term labor symptoms and general obstetric precautions including but not limited to vaginal bleeding, contractions, leaking of fluid and fetal movement were reviewed in detail with the patient. Please refer to After Visit Summary for other counseling recommendations.  Return in about 4 weeks (around 03/26/2018) for as scheduled, Postpartum visit.  Future Appointments  Date Time Provider Department Center  03/06/2018  1:15 PM [facility]OC[facility] NST WOC[facility]A WOC  03/06/2018  2:15 PM Reva BoresPratt, Tanya S, MD [facility]OC[facility] WOC  03/08/2018  7:00 AM [facility]-BSSCHED ROOM WH-BSSCHED None    Dorathy KinsmanVirginia Zuzanna Maroney, [place]

## 2018-02-26 NOTE — Patient Instructions (Signed)
[place] oatmeal bath   Cholestasis of Pregnancy Cholestasis refers to any condition that causes the flow of digestive fluid (bile) produced by the liver to slow down or stop. Cholestasis of pregnancy is most common toward the end of pregnancy (thirdtrimester), but it can occur any time during pregnancy. The condition often goes away soon after giving birth. Cholestasis may be uncomfortable, but it is usually harmless to you. However, it can be harmful to your baby. Cholestasis may increase the risk of:  Your baby being born too early (preterm delivery).  Your baby having a slow heart rate and lack of oxygen during delivery (fetal distress).  Losing your baby before delivery (stillbirth).  What are the causes? The exact cause of this condition is not known, but it may be related to:  Pregnancy hormones. The gallbladder normally holds bile until you need it to help digest fat in your diet. Pregnancy hormones may cause the flow of bile to slow down and back up into your liver. Bile may then get into your bloodstream and cause cholestasis symptoms.  Changes in your genes (genetic mutations). Specifically, genes that affect how the liver releases bile.  What increases the risk? You are more likely to develop this condition if:  You had cholestasis during a previous pregnancy.  You have a family history of cholestasis.  You have liver problems.  You are having multiple babies, such as twins or triplets.  What are the signs or symptoms? The most common symptom of this condition is intense itching (pruritus), especially on the palms of your hands and soles of your feet. The itching can spread to the rest of your body and is often worse at night. You will not usually have a rash. Other symptoms may include:  Feeling tired.  Pain in your upper right abdomen.  Dark-colored urine.  Light-colored stools.  Poor appetite.  Yellowish discoloration of your skin and the whites of your eyes  (jaundice).  How is this diagnosed? This condition is diagnosed based on:  Your medical history.  A physical exam.  Blood tests.  If you have an inherited risk for developing this condition, you may also have genetic testing. How is this treated? The goal of treatment is to make you comfortable and keep your baby safe. Your health care provider may:  Prescribe medicine to reduce bile acid in your bloodstream, relieve symptoms, and help keep your baby safe.  Give you vitamin K before delivery to prevent excessive bleeding.  Check your baby frequently (fetal monitoring).  Perform regular blood tests to check your bile levels and liver function until your baby is delivered.  Recommend starting (inducing) your labor and delivery by week 36 or 37 of pregnancy, or as soon as your baby's lungs have developed enough.  Follow these instructions at home:  Take over-the-counter and prescription medicines only as told by your health care provider.  Take cool baths to soothe itchy skin.  Wear comfortable, loose-fitting, cotton clothing to reduce itching.  Keep your fingernails short to prevent skin irritation from scratching.  Keep all follow-up visits and prenatal visits as told by your health care provider. This is important. Contact a health care provider if:  Your symptoms get worse, even with treatment.  You develop pain in your right side.  You have unusual swelling in your abdomen, feet, ankles, or legs.  You have a fever.  You are more thirsty than usual. Get help right away if:  You go into early labor.  You have a headache that does not go away or causes changes in vision.  You have nausea or you vomit.  You have severe pain in your abdomen or shoulders.  You have shortness of breath. Summary  Cholestasis of pregnancy is most common toward the end of pregnancy (thirdtrimester), but it can occur any time during your pregnancy.  The condition often goes away  soon after your baby is born.  The most common symptom of cholestasis of pregnancy is intense itching (pruritus), especially on the palms of your hands and soles of your feet.  This condition may be treated with medicine, frequent monitoring, or starting (inducing) labor and delivery by week 36 or 37 of pregnancy. This information is not intended to replace advice given to you by your health care provider. Make sure you discuss any questions you have with your health care provider. Document Released: 03/17/2000 Document Revised: 03/04/2016 Document Reviewed: 03/04/2016 Elsevier Interactive Patient Education  2017 [employer]

## 2018-02-26 NOTE — Progress Notes (Signed)

## 2018-02-27 LAB — GC/CHLAMYDIA PROBE AMP (~~LOC~~) NOT AT ARMC
Chlamydia: NEGATIVE
Neisseria Gonorrhea: NEGATIVE

## 2018-02-28 LAB — STREP GP B NAA: Strep Gp B NAA: NEGATIVE

## 2018-03-01 NOTE — Addendum Note (Signed)
Addended by: Dorathy KinsmanSMITH, Catrice Zuleta on: 03/01/2018 04:35 AM   Modules accepted: Orders, SmartSet

## 2018-03-06 ENCOUNTER — Ambulatory Visit (INDEPENDENT_AMBULATORY_CARE_PROVIDER_SITE_OTHER): Payer: Medicaid Other | Admitting: Family Medicine

## 2018-03-06 ENCOUNTER — Ambulatory Visit (INDEPENDENT_AMBULATORY_CARE_PROVIDER_SITE_OTHER): Payer: Medicaid Other | Admitting: General Practice

## 2018-03-06 ENCOUNTER — Ambulatory Visit: Payer: Self-pay

## 2018-03-06 VITALS — BP 115/67 | HR 77 | Wt 147.4 lb

## 2018-03-06 DIAGNOSIS — O26613 Liver and biliary tract disorders in pregnancy, third trimester: Secondary | ICD-10-CM | POA: Diagnosis not present

## 2018-03-06 DIAGNOSIS — K831 Obstruction of bile duct: Secondary | ICD-10-CM

## 2018-03-06 DIAGNOSIS — Z3480 Encounter for supervision of other normal pregnancy, unspecified trimester: Secondary | ICD-10-CM

## 2018-03-06 DIAGNOSIS — Z3A36 36 weeks gestation of pregnancy: Secondary | ICD-10-CM

## 2018-03-06 DIAGNOSIS — O280 Abnormal hematological finding on antenatal screening of mother: Secondary | ICD-10-CM

## 2018-03-06 DIAGNOSIS — Z3483 Encounter for supervision of other normal pregnancy, third trimester: Secondary | ICD-10-CM

## 2018-03-06 NOTE — Progress Notes (Signed)
   PRENATAL VISIT NOTE  Subjective:  Kathleen Cooke is a 22 y.o. G2P1001 at 2913w5d being seen today for ongoing prenatal care.  She is currently monitored for the following issues for this high-risk pregnancy and has Supervision of other normal pregnancy, antepartum; Abnormal genetic test; Abnormal MSAFP (maternal serum alpha-fetoprotein), elevated; Anemia; Asthma; Elevated transaminase level; and Cholestasis during pregnancy on their problem list.  Patient reports no complaints.  Contractions: Not present. Vag. Bleeding: None.  Movement: Present. Denies leaking of fluid.   The following portions of the patient's history were reviewed and updated as appropriate: allergies, current medications, past family history, past medical history, past social history, past surgical history and problem list. Problem list updated.  Objective:   Vitals:   03/06/18 1338  BP: 115/67  Pulse: 77  Weight: 147 lb 6.4 oz (66.9 kg)    Fetal Status: Fetal Heart Rate (bpm): 142 Fundal Height: 31 cm Movement: Present  Presentation: Vertex  General:  Alert, oriented and cooperative. Patient is in no acute distress.  Skin: Skin is warm and dry. No rash noted.   Cardiovascular: Normal heart rate noted  Respiratory: Normal respiratory effort, no problems with respiration noted  Abdomen: Soft, gravid, appropriate for gestational age.  Pain/Pressure: Present     Pelvic: Cervical exam deferred        Extremities: Normal range of motion.  Edema: None  Mental Status: Normal mood and affect. Normal behavior. Normal judgment and thought content.   Assessment and Plan:  Pregnancy: G2P1001 at 4113w5d  1. Cholestasis during pregnancy in third trimester Continue Actigall IOL at 37 wks For foley outpatient ripening tomorrow with NST BPP 8/8  2. Supervision of other normal pregnancy, antepartum Continue prenatal care. GBS negative   3. Abnormal MSAFP (maternal serum alpha-fetoprotein), elevated   Preterm labor  symptoms and general obstetric precautions including but not limited to vaginal bleeding, contractions, leaking of fluid and fetal movement were reviewed in detail with the patient. Please refer to After Visit Summary for other counseling recommendations.  Return in 6 weeks (on 04/17/2018) for pp check.  Future Appointments  Date Time Provider Department Center  03/07/2018  1:15 PM WOC-WOCA NST WOC-WOCA WOC  03/07/2018  2:15 PM  BingPickens, Charlie, MD WOC-WOCA WOC  03/08/2018  7:00 AM WH-BSSCHED ROOM WH-BSSCHED None    Reva Boresanya S Nitasha Jewel, MD

## 2018-03-06 NOTE — Progress Notes (Signed)
Pt informed that the ultrasound is considered a limited OB ultrasound and is not intended to be a complete ultrasound exam.  Patient also informed that the ultrasound is not being completed with the intent of assessing for fetal or placental anomalies or any pelvic abnormalities.  Explained that the purpose of today's ultrasound is to assess for  BPP, presentation and AFI.  Patient acknowledges the purpose of the exam and the limitations of the study.    NST not indicated today per Dr Shawnie PonsPratt, due to patient returning tomorrow for foley bulb placement with NST.  Chase Callerarrie H RN BSN 03/06/18

## 2018-03-06 NOTE — Patient Instructions (Signed)

## 2018-03-07 ENCOUNTER — Ambulatory Visit (INDEPENDENT_AMBULATORY_CARE_PROVIDER_SITE_OTHER): Payer: Medicaid Other | Admitting: Family Medicine

## 2018-03-07 ENCOUNTER — Ambulatory Visit (INDEPENDENT_AMBULATORY_CARE_PROVIDER_SITE_OTHER): Payer: Medicaid Other | Admitting: General Practice

## 2018-03-07 VITALS — BP 113/63 | HR 88

## 2018-03-07 DIAGNOSIS — Z3480 Encounter for supervision of other normal pregnancy, unspecified trimester: Secondary | ICD-10-CM

## 2018-03-07 DIAGNOSIS — K831 Obstruction of bile duct: Secondary | ICD-10-CM | POA: Diagnosis not present

## 2018-03-07 DIAGNOSIS — Z3483 Encounter for supervision of other normal pregnancy, third trimester: Secondary | ICD-10-CM

## 2018-03-07 DIAGNOSIS — Z3A37 37 weeks gestation of pregnancy: Secondary | ICD-10-CM

## 2018-03-07 DIAGNOSIS — O26613 Liver and biliary tract disorders in pregnancy, third trimester: Secondary | ICD-10-CM

## 2018-03-07 DIAGNOSIS — Z01812 Encounter for preprocedural laboratory examination: Secondary | ICD-10-CM

## 2018-03-07 DIAGNOSIS — O26643 Intrahepatic cholestasis of pregnancy, third trimester: Secondary | ICD-10-CM

## 2018-03-07 NOTE — Progress Notes (Signed)
NST performed before & after foley bulb placement and was found to be reactive. Patient will follow up tomorrow for IOL.  Chase Callerarrie H RN BSN 03/07/18

## 2018-03-07 NOTE — Patient Instructions (Signed)

## 2018-03-08 ENCOUNTER — Inpatient Hospital Stay (HOSPITAL_COMMUNITY): Payer: Medicaid Other | Admitting: Anesthesiology

## 2018-03-08 ENCOUNTER — Encounter (HOSPITAL_COMMUNITY): Payer: Self-pay

## 2018-03-08 ENCOUNTER — Inpatient Hospital Stay (HOSPITAL_COMMUNITY)
Admission: RE | Admit: 2018-03-08 | Discharge: 2018-03-10 | DRG: 805 | Disposition: A | Discharge status: Home / Self Care | Payer: Medicaid Other | Attending: Obstetrics and Gynecology | Admitting: Obstetrics and Gynecology

## 2018-03-08 VITALS — BP 118/76 | HR 58 | Temp 97.8°F | Resp 16 | Ht 62.0 in | Wt 147.4 lb

## 2018-03-08 DIAGNOSIS — J45909 Unspecified asthma, uncomplicated: Secondary | ICD-10-CM | POA: Diagnosis present

## 2018-03-08 DIAGNOSIS — Z3A37 37 weeks gestation of pregnancy: Secondary | ICD-10-CM | POA: Diagnosis not present

## 2018-03-08 DIAGNOSIS — O26643 Intrahepatic cholestasis of pregnancy, third trimester: Secondary | ICD-10-CM

## 2018-03-08 DIAGNOSIS — K831 Obstruction of bile duct: Secondary | ICD-10-CM | POA: Diagnosis present

## 2018-03-08 DIAGNOSIS — O26613 Liver and biliary tract disorders in pregnancy, third trimester: Secondary | ICD-10-CM

## 2018-03-08 DIAGNOSIS — J4520 Mild intermittent asthma, uncomplicated: Secondary | ICD-10-CM

## 2018-03-08 DIAGNOSIS — Z3480 Encounter for supervision of other normal pregnancy, unspecified trimester: Secondary | ICD-10-CM

## 2018-03-08 DIAGNOSIS — O9952 Diseases of the respiratory system complicating childbirth: Secondary | ICD-10-CM | POA: Diagnosis present

## 2018-03-08 DIAGNOSIS — O2662 Liver and biliary tract disorders in childbirth: Principal | ICD-10-CM | POA: Diagnosis present

## 2018-03-08 HISTORY — DX: Obstruction of bile duct: K83.1

## 2018-03-08 HISTORY — DX: Intrahepatic cholestasis of pregnancy, third trimester: O26.643

## 2018-03-08 LAB — CBC
HCT: 31.8 % — ABNORMAL LOW (ref 36.0–46.0)
Hemoglobin: 9.9 g/dL — ABNORMAL LOW (ref 12.0–15.0)
MCH: 24.8 pg — ABNORMAL LOW (ref 26.0–34.0)
MCHC: 31.1 g/dL (ref 30.0–36.0)
MCV: 79.7 fL — ABNORMAL LOW (ref 80.0–100.0)
Platelets: 305 10*3/uL (ref 150–400)
RBC: 3.99 MIL/uL (ref 3.87–5.11)
RDW: 16.8 % — ABNORMAL HIGH (ref 11.5–15.5)
WBC: 12.7 10*3/uL — ABNORMAL HIGH (ref 4.0–10.5)
nRBC: 0 % (ref 0.0–0.2)

## 2018-03-08 LAB — TYPE AND SCREEN
ABO/RH(D): O POS
Antibody Screen: NEGATIVE

## 2018-03-08 LAB — RPR: RPR Ser Ql: NONREACTIVE

## 2018-03-08 MED ORDER — PHENYLEPHRINE 40 MCG/ML (10ML) SYRINGE FOR IV PUSH (FOR BLOOD PRESSURE SUPPORT)
80.0000 ug | PREFILLED_SYRINGE | INTRAVENOUS | Status: DC | PRN
  Filled 2018-03-08: qty 10

## 2018-03-08 MED ORDER — LIDOCAINE HCL (PF) 1 % IJ SOLN
INTRAMUSCULAR | Status: DC | PRN
  Administered 2018-03-08: 13 mL via EPIDURAL

## 2018-03-08 MED ORDER — HYDROXYZINE HCL 25 MG PO TABS
25.0000 mg | ORAL_TABLET | Freq: Three times a day (TID) | ORAL | Status: DC | PRN
  Filled 2018-03-08: qty 1

## 2018-03-08 MED ORDER — OXYCODONE-ACETAMINOPHEN 5-325 MG PO TABS: 1.0000 | ORAL_TABLET | ORAL | Status: DC | PRN

## 2018-03-08 MED ORDER — OXYTOCIN 40 UNITS IN LACTATED RINGERS INFUSION - SIMPLE MED: 2.5000 [IU]/h | INTRAVENOUS | Status: DC

## 2018-03-08 MED ORDER — LIDOCAINE HCL (PF) 1 % IJ SOLN
30.0000 mL | INTRAMUSCULAR | Status: DC | PRN
  Filled 2018-03-08: qty 30

## 2018-03-08 MED ORDER — TERBUTALINE SULFATE 1 MG/ML IJ SOLN
0.2500 mg | Freq: Once | INTRAMUSCULAR | Status: DC | PRN
  Filled 2018-03-08: qty 1

## 2018-03-08 MED ORDER — ONDANSETRON HCL 4 MG/2ML IJ SOLN: 4.0000 mg | Freq: Four times a day (QID) | INTRAMUSCULAR | Status: DC | PRN

## 2018-03-08 MED ORDER — SOD CITRATE-CITRIC ACID 500-334 MG/5ML PO SOLN: 30.0000 mL | ORAL | Status: DC | PRN

## 2018-03-08 MED ORDER — OXYTOCIN BOLUS FROM INFUSION
500.0000 mL | Freq: Once | INTRAVENOUS | Status: AC
  Administered 2018-03-08: 500 mL via INTRAVENOUS

## 2018-03-08 MED ORDER — MISOPROSTOL 25 MCG QUARTER TABLET
25.0000 ug | ORAL_TABLET | ORAL | Status: DC | PRN
  Administered 2018-03-08: 25 ug via VAGINAL
  Filled 2018-03-08 (×2): qty 1

## 2018-03-08 MED ORDER — EPHEDRINE 5 MG/ML INJ
10.0000 mg | INTRAVENOUS | Status: DC | PRN
  Filled 2018-03-08: qty 2
  Filled 2018-03-08: qty 4

## 2018-03-08 MED ORDER — URSODIOL 300 MG PO CAPS
300.0000 mg | ORAL_CAPSULE | Freq: Three times a day (TID) | ORAL | Status: DC
  Administered 2018-03-08: 300 mg via ORAL
  Filled 2018-03-08 (×4): qty 1

## 2018-03-08 MED ORDER — LACTATED RINGERS IV SOLN: 500.0000 mL | Freq: Once | INTRAVENOUS | Status: DC

## 2018-03-08 MED ORDER — OXYTOCIN 40 UNITS IN LACTATED RINGERS INFUSION - SIMPLE MED
1.0000 m[IU]/min | INTRAVENOUS | Status: DC
  Administered 2018-03-08: 2 m[IU]/min via INTRAVENOUS
  Filled 2018-03-08: qty 1000

## 2018-03-08 MED ORDER — LACTATED RINGERS IV SOLN
INTRAVENOUS | Status: DC
  Administered 2018-03-08 (×2): via INTRAVENOUS

## 2018-03-08 MED ORDER — FLEET ENEMA 7-19 GM/118ML RE ENEM: 1.0000 | ENEMA | RECTAL | Status: DC | PRN

## 2018-03-08 MED ORDER — ACETAMINOPHEN 325 MG PO TABS: 650.0000 mg | ORAL_TABLET | ORAL | Status: DC | PRN

## 2018-03-08 MED ORDER — OXYCODONE-ACETAMINOPHEN 5-325 MG PO TABS: 2.0000 | ORAL_TABLET | ORAL | Status: DC | PRN

## 2018-03-08 MED ORDER — PHENYLEPHRINE 40 MCG/ML (10ML) SYRINGE FOR IV PUSH (FOR BLOOD PRESSURE SUPPORT)
80.0000 ug | PREFILLED_SYRINGE | INTRAVENOUS | Status: DC | PRN
  Filled 2018-03-08 (×2): qty 10

## 2018-03-08 MED ORDER — EPHEDRINE 5 MG/ML INJ
10.0000 mg | INTRAVENOUS | Status: DC | PRN
  Filled 2018-03-08: qty 2

## 2018-03-08 MED ORDER — LACTATED RINGERS IV SOLN: 500.0000 mL | INTRAVENOUS | Status: DC | PRN

## 2018-03-08 MED ORDER — DIPHENHYDRAMINE HCL 50 MG/ML IJ SOLN: 12.5000 mg | INTRAMUSCULAR | Status: DC | PRN

## 2018-03-08 MED ORDER — FENTANYL 2.5 MCG/ML BUPIVACAINE 1/10 % EPIDURAL INFUSION (WH - ANES)
14.0000 mL/h | INTRAMUSCULAR | Status: DC | PRN
  Administered 2018-03-08: 14 mL/h via EPIDURAL
  Filled 2018-03-08: qty 100

## 2018-03-08 MED ORDER — FENTANYL CITRATE (PF) 100 MCG/2ML IJ SOLN: 50.0000 ug | INTRAMUSCULAR | Status: DC | PRN

## 2018-03-08 NOTE — Progress Notes (Signed)
   PRENATAL VISIT NOTE  Subjective:  Kathleen Cooke is a 22 y.o. G2P1001 at 8263w0d being seen today for ongoing prenatal care.  She is currently monitored for the following issues for this high-risk pregnancy and has Supervision of other normal pregnancy, antepartum; Abnormal genetic test; Abnormal MSAFP (maternal serum alpha-fetoprotein), elevated; Anemia; Asthma; Elevated transaminase level; Cholestasis during pregnancy; and Cholestasis during pregnancy in third trimester on their problem list.  Patient reports itching.  Contractions: Not present. Vag. Bleeding: None.  Movement: Present. Denies leaking of fluid.   The following portions of the patient's history were reviewed and updated as appropriate: allergies, current medications, past family history, past medical history, past social history, past surgical history and problem list. Problem list updated.  Objective:   Vitals:   03/07/18 1419  BP: 113/63  Pulse: 88    Fetal Status: Fetal Heart Rate (bpm): NST   Movement: Present     General:  Alert, oriented and cooperative. Patient is in no acute distress.  Skin: Skin is warm and dry. No rash noted.   Cardiovascular: Normal heart rate noted  Respiratory: Normal respiratory effort, no problems with respiration noted  Abdomen: Soft, gravid, appropriate for gestational age.  Pain/Pressure: Present     Pelvic: Cervical exam deferred        Extremities: Normal range of motion.  Edema: None  Mental Status:  Normal mood and affect. Normal behavior. Normal judgment and thought content.  Procedure: Patient informed of R/B/A of procedure. NST was performed and was reactive prior to procedure. NST:  EFM: Baseline: 135 bpm, Variability: Good {> 6 bpm), Accelerations: Reactive and Decelerations: Absent Toco: none Procedure done to begin ripening of the cervix prior to admission for induction of labor. Appropriate time out taken. The patient was placed in the lithotomy position and a  cervical exam was performed and a finger was used to guide the 89F foley balloon through the internal os of the cervix. Foley Balloon filled with 60cc of normal saline. Plug inserted into end of the foley. Foley placed on tension and taped to medial thigh.  NST:  EFM Baseline: 135 bpm, Variability: Good {> 6 bpm), Accelerations: Reactive and Decelerations: Absent  Toco: none There were no signs of tachysystole or hypertonus. All equipment was removed and accounted for. The patient tolerated the procedure well.  Assessment and Plan:  Pregnancy: G2P1001 at 463w0d 1. Cholestasis during pregnancy in third trimester  S/p Outpatient placement of foley balloon catheter for cervical ripening. Induction of labor scheduled for tomorrow at 0700 am. Reassuring FHR tracing with no concerns at present. Warning signs given to patient to include return to MAU for heavy vaginal bleeding, Rupture of membranes, painful uterine contractions q 5 mins or less, severe abdominal discomfort, decreased fetal movement.  Return in about 4 weeks (around 04/04/2018) for pp check.   Reva Boresanya S Pratt, MD 03/08/2018 11:23 AM

## 2018-03-08 NOTE — Anesthesia Procedure Notes (Signed)
Epidural Patient location during procedure: OB Start time: 03/08/2018 7:47 PM End time: 03/08/2018 7:58 PM  Staffing Anesthesiologist: Lowella CurbMiller, Euan Wandler Ray, MD Performed: anesthesiologist   Preanesthetic Checklist Completed: patient identified, site marked, surgical consent, pre-op evaluation, timeout performed, IV checked, risks and benefits discussed and monitors and equipment checked  Epidural Patient position: sitting Prep: ChloraPrep Patient monitoring: heart rate, cardiac monitor, continuous pulse ox and blood pressure Approach: midline Location: L2-L3 Injection technique: LOR saline  Needle:  Needle type: Tuohy  Needle gauge: 17 G Needle length: 9 cm Needle insertion depth: 4 cm Catheter type: closed end flexible Catheter size: 20 Guage Catheter at skin depth: 8 cm Test dose: negative  Assessment Events: blood not aspirated, injection not painful, no injection resistance, negative IV test and no paresthesia  Additional Notes Reason for block:procedure for pain

## 2018-03-08 NOTE — Progress Notes (Addendum)
LABOR PROGRESS NOTE  Kathleen Cooke is a 22 y.o. G2P1001 at 270w0d  admitted for IOL for cholestasis . S/p foley placement placed in clinic. Pitocin started at 1215.   Subjective:.  Patient reports minimal cramping, coping well   Objective: BP 104/65   Pulse 66   Temp 98.4 F (36.9 C) (Oral)   Resp 16   Ht 5\' 2"  (1.575 m)   Wt 66.9 kg   LMP 06/22/2017 (Approximate)   BMI 26.96 kg/m  or  Vitals:   03/08/18 1335 03/08/18 1409 03/08/18 1437 03/08/18 1600  BP: (!) 108/59 113/74 104/65   Pulse: 68 67 66   Resp: 16 16 16 16   Temp:    98.4 F (36.9 C)  TempSrc:    Oral  Weight:      Height:        Dilation: 4 Effacement (%): 40 Cervical Position: Posterior Station: -3 Presentation: Vertex Exam by:: S. Ellianna Ruest, Kathleen Cooke. Kathleen Cooke, Kathleen Cooke FHT: category 1 fetal strip Toco: contracting irregularly every 2-7 min  AROM: 1830 clear, moderate amount of odorless fluid  Pitocin currently at 3622mu/min  Labs: Lab Results  Component Value Date   WBC 12.7 (H) 03/08/2018   HGB 9.9 (L) 03/08/2018   HCT 31.8 (L) 03/08/2018   MCV 79.7 (L) 03/08/2018   PLT 305 03/08/2018    Patient Active Problem List   Diagnosis Date Noted  . 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 Cholestasis during pregnancy 02/25/2018  . Elevated transaminase level 02/21/2018  . Asthma 01/16/2018  . Abnormal genetic test 11/06/2017  . Abnormal MSAFP (maternal serum alpha-fetoprotein), elevated 11/06/2017  . Anemia 11/06/2017  . Supervision of other normal pregnancy, antepartum 10/25/2017    Assessment / Plan: 22 y.o. G2P1001 at 7470w0d here for IOL for Cholestasis   Labor: IOL with pitocin/AROM  Fetal Wellbeing:  Category 1  Pain Control:  Minimal cramping per patient, coping well. Plan is for epidural once patient requests Anticipated MOD:  NSVD Continue to titrate pitocin as long as fetus tolerates   Kathleen Cooke, Kathleen Cooke

## 2018-03-08 NOTE — H&P (Addendum)
OBSTETRIC ADMISSION HISTORY AND PHYSICAL  Kathleen Cooke is a 22 y.o. female G2P1001 with IUP at 6733w0d presenting for IOL due to cholestasis. She reports +FMs. No LOF, VB, blurry vision, headaches, peripheral edema, or RUQ pain. She plans on bottle feeding. She requests depo for birth control.  Dating: By L/8 --->  Estimated Date of Delivery: 03/29/18  Sono:   @[redacted]w[redacted]d , CWD, normal anatomy, cephalic presentation, 2640 g, 70%ile  Prenatal History/Complications: No complications in prior pregnancy  Past Medical History: Past Medical History:  Diagnosis Date  . Asthma   . Headache     Past Surgical History: Past Surgical History:  Procedure Laterality Date  . NO PAST SURGERIES      Obstetrical History: OB History    Gravida  2   Para  1   Term  1   Preterm  0   AB  0   Living  1     SAB  0   TAB  0   Ectopic  0   Multiple  0   Live Births  1           Social History: Social History   Socioeconomic History  . Marital status: Single    Spouse name: Not on file  . Number of children: Not on file  . Years of education: Not on file  . Highest education level: Not on file  Occupational History  . Not on file  Social Needs  . Financial resource strain: Not on file  . Food insecurity:    Worry: Not on file    Inability: Not on file  . Transportation needs:    Medical: Not on file    Non-medical: Not on file  Tobacco Use  . Smoking status: Never Smoker  . Smokeless tobacco: Never Used  Substance and Sexual Activity  . Alcohol use: No  . Drug use: Not Currently    Types: Marijuana    Comment: used cocaine 1.5 yrs ago   . Sexual activity: Not Currently    Birth control/protection: None  Lifestyle  . Physical activity:    Days per week: Not on file    Minutes per session: Not on file  . Stress: Not on file  Relationships  . Social connections:    Talks on phone: Not on file    Gets together: Not on file    Attends religious service: Not on  file    Active member of club or organization: Not on file    Attends meetings of clubs or organizations: Not on file    Relationship status: Not on file  Other Topics Concern  . Not on file  Social History Narrative  . Not on file    Family History: Family History  Problem Relation Age of Onset  . Birth defects Neg Hx   . Diabetes Neg Hx   . Early death Neg Hx   . Heart disease Neg Hx   . Hypertension Neg Hx   . Stroke Neg Hx   . Miscarriages / Stillbirths Neg Hx     Allergies: No Known Allergies  Medications Prior to Admission  Medication Sig Dispense Refill Last Dose  . ferrous gluconate (FERGON) 324 MG tablet TAKE 1 TABLET (324 MG TOTAL) BY MOUTH 2 (TWO) TIMES DAILY WITH A MEAL. 180 tablet 1 03/07/2018 at Unknown time  . Prenatal Vit-Fe Fumarate-FA (PRENATAL VITAMIN PO) Take by mouth.   03/07/2018 at Unknown time  . ursodiol (ACTIGALL) 500 MG tablet Take  1 tablet (500 mg total) by mouth 2 (two) times daily. 60 tablet 3 03/07/2018 at Unknown time  . albuterol (PROVENTIL HFA;VENTOLIN HFA) 108 (90 Base) MCG/ACT inhaler Inhale 2 puffs into the lungs every 6 (six) hours as needed for wheezing or shortness of breath. 1 Inhaler 2 Taking  . diphenhydrAMINE (BENADRYL) 25 MG tablet Take 1-2 tablets (25-50 mg total) by mouth every 6 (six) hours as needed for itching. (Patient not taking: Reported on 03/08/2018) 60 tablet 1 Not Taking at Unknown time  . ondansetron (ZOFRAN ODT) 4 MG disintegrating tablet Take 1 tablet (4 mg total) by mouth every 8 (eight) hours as needed for nausea or vomiting. (Patient not taking: Reported on 03/08/2018) 30 tablet 0 Not Taking at Unknown time     Review of Systems   All systems reviewed and negative except as stated in HPI  Blood pressure 122/60, pulse 77, temperature 97.8 F (36.6 C), temperature source Oral, resp. rate 16, height 5\' 2"  (1.575 m), weight 66.9 kg, last menstrual period 06/22/2017. General appearance: alert, cooperative and no  distress Lungs: regular rate and effort Heart: regular rate  Abdomen: soft, non-tender Extremities: Homans sign is negative, no sign of DVT Presentation: cephalic Fetal monitoringBaseline: 130s bpm, Variability: Good {> 6 bpm), Accelerations: Reactive and Decelerations: Absent Uterine activity: No cx at this time Dilation: 4 Effacement (%): 60 Station: -2 Exam by:: LIma, rn   Prenatal labs: ABO, Rh: --/--/O POS (12/06 8119) Antibody: NEG (12/06 0748) Rubella: 1.97 (07/25 1113) RPR: Non Reactive (09/23 0839)  HBsAg: Negative (11/21 1536)  HIV: Non Reactive (09/23 0839)  GBS: Negative (11/26 1421)  2 hr GTT Normal in third tri  Prenatal Transfer Tool  Maternal Diabetes: No Genetic Screening: Normal Maternal Ultrasounds/Referrals: Normal Fetal Ultrasounds or other Referrals:  None Maternal Substance Abuse:  No Significant Maternal Medications:  None Significant Maternal Lab Results: Lab values include: Group B Strep negative  Results for orders placed or performed during the hospital encounter of 03/08/18 (from the past 24 hour(s))  CBC   Collection Time: 03/08/18  7:48 AM  Result Value Ref Range   WBC 12.7 (H) 4.0 - 10.5 K/uL   RBC 3.99 3.87 - 5.11 MIL/uL   Hemoglobin 9.9 (L) 12.0 - 15.0 g/dL   HCT 14.7 (L) 82.9 - 56.2 %   MCV 79.7 (L) 80.0 - 100.0 fL   MCH 24.8 (L) 26.0 - 34.0 pg   MCHC 31.1 30.0 - 36.0 g/dL   RDW 13.0 (H) 86.5 - 78.4 %   Platelets 305 150 - 400 K/uL   nRBC 0.0 0.0 - 0.2 %  Type and screen   Collection Time: 03/08/18  7:48 AM  Result Value Ref Range   ABO/RH(D) O POS    Antibody Screen NEG    Sample Expiration      03/11/2018 Performed at [facility], [address] [place], [place] [address]     Patient Active Problem List   Diagnosis Date Noted  . 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 Cholestasis during pregnancy 02/25/2018  . Elevated transaminase level 02/21/2018  . Asthma 01/16/2018  . Abnormal genetic  test 11/06/2017  . Abnormal MSAFP (maternal serum alpha-fetoprotein), elevated 11/06/2017  . Anemia 11/06/2017  . Supervision of other normal pregnancy, antepartum 10/25/2017    Assessment: Kathleen Cooke is a 22 y.o. female G2P1001 with IUP at [redacted]w[redacted]d presenting for IOL due to cholestasis.   1. Labor: No active labor at this time. 2. FWB: Reactive  strip, reassuring FHT. 3. Pain: Epidural desired once pain present. 4. GBS: Negative 5. Cholestasis: Symptoms of itching   Plan:  1. Labor: s/p vaginal Cytotec. Consider starting Pitocin soon given dilation of 4 cm. 2. FWB: Continue to monitor. 3. Pain: Epidural PRN with IV pain medication PRN prior. 4. GBS: No prophylaxis required. 5. Cholestasis: Ursodiol 300 mg TID and vistaril 50 mg PRN  Awilda Metro, MD  03/08/2018, 10:27 AM  CNM attestation:  I have seen and examined this patient; I agree with above documentation in the resident's note.   Kathleen Cooke is a 22 y.o. G2P1001 here for IOL due to cholestasis  PE: BP 104/65   Pulse 66   Temp 98.4 F (36.9 C) (Oral)   Resp 16   Ht 5\' 2"  (1.575 m)   Wt 66.9 kg   LMP 06/22/2017 (Approximate)   BMI 26.96 kg/m  Gen: calm comfortable, NAD Resp: normal effort, no distress Abd: gravid  ROS, labs, PMH reviewed  Plan: Admit to [facility] Had cervical foley placed yesterday- came out at 2000 last PM Will do a dose of cytotec followed by Pit Will try Vistaril for itching Anticipate SVD  Arabella Merles CNM 03/08/2018, 4:30 PM

## 2018-03-08 NOTE — Anesthesia Preprocedure Evaluation (Signed)
Anesthesia Evaluation  Patient identified by MRN, date of birth, ID band Patient awake    Reviewed: Allergy & Precautions, NPO status , Patient's Chart, lab work & pertinent test results  Airway Mallampati: I  TM Distance: >3 FB Neck ROM: Full    Dental  (+) Teeth Intact   Pulmonary asthma ,    breath sounds clear to auscultation       Cardiovascular negative cardio ROS   Rhythm:Regular     Neuro/Psych  Headaches, negative psych ROS   GI/Hepatic negative GI ROS, Neg liver ROS,   Endo/Other  negative endocrine ROS  Renal/GU negative Renal ROS  negative genitourinary   Musculoskeletal negative musculoskeletal ROS (+)   Abdominal   Peds negative pediatric ROS (+)  Hematology negative hematology ROS (+)   Anesthesia Other Findings   Reproductive/Obstetrics (+) Pregnancy                             Lab Results  Component Value Date   WBC 12.7 (H) 03/08/2018   HGB 9.9 (L) 03/08/2018   HCT 31.8 (L) 03/08/2018   MCV 79.7 (L) 03/08/2018   PLT 305 03/08/2018   No results found for: INR, PROTIME   Anesthesia Physical  Anesthesia Plan  ASA: II  Anesthesia Plan: Epidural   Post-op Pain Management:    Induction:   PONV Risk Score and Plan:   Airway Management Planned:   Additional Equipment:   Intra-op Plan:   Post-operative Plan:   Informed Consent: I have reviewed the patients History and Physical, chart, labs and discussed the procedure including the risks, benefits and alternatives for the proposed anesthesia with the patient or authorized representative who has indicated his/her understanding and acceptance.     Plan Discussed with:   Anesthesia Plan Comments:         Anesthesia Quick Evaluation

## 2018-03-09 ENCOUNTER — Other Ambulatory Visit: Payer: Self-pay

## 2018-03-09 ENCOUNTER — Encounter (HOSPITAL_COMMUNITY): Payer: Self-pay

## 2018-03-09 MED ORDER — SIMETHICONE 80 MG PO CHEW: 80.0000 mg | CHEWABLE_TABLET | ORAL | Status: DC | PRN

## 2018-03-09 MED ORDER — ZOLPIDEM TARTRATE 5 MG PO TABS: 5.0000 mg | ORAL_TABLET | Freq: Every evening | ORAL | Status: DC | PRN

## 2018-03-09 MED ORDER — ONDANSETRON HCL 4 MG/2ML IJ SOLN: 4.0000 mg | INTRAMUSCULAR | Status: DC | PRN

## 2018-03-09 MED ORDER — DIBUCAINE 1 % RE OINT: 1.0000 "application " | TOPICAL_OINTMENT | RECTAL | Status: DC | PRN

## 2018-03-09 MED ORDER — PRENATAL MULTIVITAMIN CH
1.0000 | ORAL_TABLET | Freq: Every day | ORAL | Status: DC
  Administered 2018-03-09 – 2018-03-10 (×2): 1 via ORAL
  Filled 2018-03-09 (×2): qty 1

## 2018-03-09 MED ORDER — DIPHENHYDRAMINE HCL 25 MG PO CAPS
25.0000 mg | ORAL_CAPSULE | Freq: Four times a day (QID) | ORAL | Status: DC | PRN
  Administered 2018-03-09: 25 mg via ORAL
  Filled 2018-03-09: qty 1

## 2018-03-09 MED ORDER — IBUPROFEN 600 MG PO TABS
600.0000 mg | ORAL_TABLET | Freq: Four times a day (QID) | ORAL | Status: DC
  Administered 2018-03-09 – 2018-03-10 (×7): 600 mg via ORAL
  Filled 2018-03-09 (×7): qty 1

## 2018-03-09 MED ORDER — OXYCODONE HCL 5 MG PO TABS: 5.0000 mg | ORAL_TABLET | ORAL | Status: DC | PRN

## 2018-03-09 MED ORDER — ACETAMINOPHEN 325 MG PO TABS: 650.0000 mg | ORAL_TABLET | ORAL | Status: DC | PRN

## 2018-03-09 MED ORDER — BENZOCAINE-MENTHOL 20-0.5 % EX AERO: 1.0000 "application " | INHALATION_SPRAY | CUTANEOUS | Status: DC | PRN

## 2018-03-09 MED ORDER — SENNOSIDES-DOCUSATE SODIUM 8.6-50 MG PO TABS
2.0000 | ORAL_TABLET | ORAL | Status: DC
  Administered 2018-03-09 – 2018-03-10 (×2): 2 via ORAL
  Filled 2018-03-09 (×2): qty 2

## 2018-03-09 MED ORDER — WITCH HAZEL-GLYCERIN EX PADS: 1.0000 "application " | MEDICATED_PAD | CUTANEOUS | Status: DC | PRN

## 2018-03-09 MED ORDER — COCONUT OIL OIL: 1.0000 "application " | TOPICAL_OIL | Status: DC | PRN

## 2018-03-09 MED ORDER — TETANUS-DIPHTH-ACELL PERTUSSIS 5-2.5-18.5 LF-MCG/0.5 IM SUSP: 0.5000 mL | Freq: Once | INTRAMUSCULAR | Status: DC

## 2018-03-09 MED ORDER — ONDANSETRON HCL 4 MG PO TABS: 4.0000 mg | ORAL_TABLET | ORAL | Status: DC | PRN

## 2018-03-09 NOTE — Progress Notes (Signed)
CSW acknowledged consult and completed chart review. Per bedside nurse MOB plans to care for infant and has declined to meet with CSW.    Please contact the Clinical Social Worker if needs arise, by MOB request, or if MOB scores greater than 9/yes to question 10 on Edinburgh Postpartum Depression Screen.  Tommey Barret Boyd-Gilyard, MSW, LCSW Clinical Social Work [phone]  

## 2018-03-09 NOTE — Anesthesia Postprocedure Evaluation (Signed)
Anesthesia Post Note  Patient: Kathleen Cooke  Procedure(s) Performed: AN AD HOC LABOR EPIDURAL     Patient location during evaluation: Mother Baby Anesthesia Type: Epidural Level of consciousness: awake and alert Pain management: pain level controlled Vital Signs Assessment: post-procedure vital signs reviewed and stable Respiratory status: spontaneous breathing, nonlabored ventilation and respiratory function stable Cardiovascular status: stable Postop Assessment: no headache, no backache, epidural receding, able to ambulate, adequate PO intake, no apparent nausea or vomiting and patient able to bend at knees Anesthetic complications: no    Last Vitals:  Vitals:   03/09/18 0215 03/09/18 0615  BP: 106/67 (!) 101/54  Pulse: 66 73  Resp: 16 18  Temp: 37.1 C 36.8 C  SpO2:      Last Pain:  Vitals:   03/09/18 0750  TempSrc:   PainSc: 0-No pain   Pain Goal:                 Land O'LakesMalinova,Tamanika Heiney Hristova

## 2018-03-09 NOTE — Lactation Note (Signed)
This note was copied from a baby's chart. Lactation Consultation Note  Patient Name: Kathleen Cooke WGNFA'OToday's Date: 03/09/2018 Reason for consult: Initial assessment;Early term 37-38.6wks P2, 7 hour female infant. Mom's feeding choice is breastfeeding and supplementing with formula. Per mom, she BF and formula feed her son for 8 months. LC entered room mom was giving infant Rush BarerGerber formula approximately 2 ml with slow flow bottle nipple. Mom was receptive of breastfeeding, Mom felt she did not have any breast milk to give infant.  LC discussed hand expression and mom easily expressed 4 ml of colostrum that was spoon feed to infant. Mom latched infant  to left breast using the cross cradle hold and infant BF for 15 minutes and was still BF as LC left the room.  Mom goals: 1. Mom will BF according hunger cues and will not exceed 3 hours without BF infant. 2. Mom plans to BF first then hand express and give infant back volume of EBM / or to offer formula this is  her choice.  3. Mom will call Nurse or LC for BF assistance if she has any questions or concerns.   Maternal Data Formula Feeding for Exclusion: Yes Reason for exclusion: Mother's choice to formula and breast feed on admission Has patient been taught Hand Expression?: Yes(Mom hand expressed 4 ml of colostrum that was given to infant by spoon.) Does the patient have breastfeeding experience prior to this delivery?: Yes  Feeding Feeding Type: Breast Fed  LATCH Score Latch: Grasps breast easily, tongue down, lips flanged, rhythmical sucking.  Audible Swallowing: Spontaneous and intermittent  Type of Nipple: Everted at rest and after stimulation  Comfort (Breast/Nipple): Soft / non-tender  Hold (Positioning): Assistance needed to correctly position infant at breast and maintain latch.  LATCH Score: 9  Interventions Interventions: Breast feeding basics reviewed;Assisted with latch;Skin to skin;Adjust position;Breast  compression;Breast massage;Support pillows;Hand express;Position options;Expressed milk  Lactation Tools Discussed/Used     Consult Status Consult Status: Follow-up Date: 03/10/18 Follow-up type: In-patient    Danelle EarthlyRobin Kahliyah Dick 03/09/2018, 6:37 AM

## 2018-03-09 NOTE — Progress Notes (Signed)
POSTPARTUM PROGRESS NOTE  Post Partum Day 1  Subjective:  Kathleen Cooke is a 22 y.o. [id number] s/p SVD at 2438w0d.  She reports she is doing well. No acute events overnight. She denies any problems with ambulating, voiding or po intake. Denies nausea or vomiting.  Pain is well controlled.  Lochia is mild.  Objective: Blood pressure (!) 101/54, pulse 73, temperature 98.2 F (36.8 C), temperature source Oral, resp. rate 18, height 5\' 2"  (1.575 m), weight 66.9 kg, last menstrual period 06/22/2017, SpO2 98 %, unknown if currently breastfeeding.  Physical Exam:  General: alert, cooperative and no distress Chest: no respiratory distress Heart:regular rate, distal pulses intact Abdomen: soft, nontender,  Uterine Fundus: firm, appropriately tender DVT Evaluation: No calf swelling or tenderness Extremities: no edema Skin: warm, dry  Recent Labs    03/08/18 0748  HGB 9.9*  HCT 31.8*    Assessment/Plan: Kathleen Cooke is a 22 y.o. [id number] s/p SVD at 6938w0d   PPD#1 - Doing well  Routine postpartum care  Contraception: Depo Feeding: Breast and bottle  Dispo: Plan for discharge tomorrow.   LOS: 1 day   Genene ChurnJohn Cook, MS3 03/09/2018, 7:29 AM   Midwife attestation Post Partum Day 1 I have seen and examined this patient and agree with above documentation in the student's note.   Kathleen Cooke is a 22 y.o. [id number] s/p NSVD.  Pt denies problems with ambulating, voiding or po intake. Pain is well controlled.  Plan for birth control is OCPs vs Depo.  Method of Feeding: formula  PE:  BP 122/86 (BP Location: Right Arm)   Pulse 62   Temp 98.2 F (36.8 C) (Oral)   Resp 18   Ht 5\' 2"  (1.575 m)   Wt 66.9 kg   LMP 06/22/2017 (Approximate)   SpO2 100%   Breastfeeding? Unknown   BMI 26.96 kg/m  Gen: well appearing Heart: reg rate Lungs: normal WOB Fundus firm Ext: soft, no pain, no edema  Plan for discharge: tomorrow  Sharen CounterLisa Leftwich-Kirby, CNM 8:48 PM

## 2018-03-10 MED ORDER — IBUPROFEN 600 MG PO TABS: 600.0000 mg | ORAL_TABLET | Freq: Four times a day (QID) | ORAL | 0 refills | Status: DC

## 2018-03-10 NOTE — Lactation Note (Signed)
This note was copied from a baby's chart. Lactation Consultation Note:  Mother reports that her nipples are too sore to breastfeed infant.  Observed tiny scabs on both nipples. Mother was given comfort gels. Suggested that mother use a wide base bottle nipple for feeding .  Mother was given a hand pump with instructions. Advised to pump for 15 mins on each breast to protect milk supply.  Mother advised to allow for healing and then offer infant breast.  Discussed treatment and prevention of engorgement.  Mother receptive to all teaching.  Mother is aware [facility] and community support.   Patient Name: Kathleen Kathleen Cooke WUJWJ'XToday's Date: 03/10/2018     Maternal Data    Feeding    LATCH Score                   Interventions    Lactation Tools Discussed/Used     Consult Status      Kathleen Cooke, Kathleen Cooke 03/10/2018, 12:19 PM

## 2018-03-10 NOTE — Discharge Instructions (Signed)
Vaginal Delivery Vaginal delivery means that you will give birth by pushing your baby out of your birth canal (vagina). A team of health care providers will help you before, during, and after vaginal delivery. Birth experiences are unique for every woman and every pregnancy, and birth experiences vary depending on where you choose to give birth. What should I do to prepare for my baby's birth? Before your baby is born, it is important to talk with your health care provider about:  Your labor and delivery preferences. These may include: ? Medicines that you may be given. ? How you will manage your pain. This might include non-medical pain relief techniques or injectable pain relief such as epidural analgesia. ? How you and your baby will be monitored during labor and delivery. ? Who may be in the labor and delivery room with you. ? Your feelings about surgical delivery of your baby (cesarean delivery, or C-section) if this becomes necessary. ? Your feelings about receiving donated blood through an IV tube (blood transfusion) if this becomes necessary.  Whether you are able: ? To take pictures or videos of the birth. ? To eat during labor and delivery. ? To move around, walk, or change positions during labor and delivery.  What to expect after your baby is born, such as: ? Whether delayed umbilical cord clamping and cutting is offered. ? Who will care for your baby right after birth. ? Medicines or tests that may be recommended for your baby. ? Whether breastfeeding is supported in your hospital or birth center. ? How long you will be in the hospital or birth center.  How any medical conditions you have may affect your baby or your labor and delivery experience.  To prepare for your baby's birth, you should also:  Attend all of your health care visits before delivery (prenatal visits) as recommended by your health care provider. This is important.  Prepare your home for your baby's  arrival. Make sure that you have: ? Diapers. ? Baby clothing. ? Feeding equipment. ? Safe sleeping arrangements for you and your baby.  Install a car seat in your vehicle. Have your car seat checked by a certified car seat installer to make sure that it is installed safely.  Think about who will help you with your new baby at home for at least the first several weeks after delivery.  What can I expect when I arrive at the birth center or hospital? Once you are in labor and have been admitted into the hospital or birth center, your health care provider may:  Review your pregnancy history and any concerns you have.  Insert an IV tube into one of your veins. This is used to give you fluids and medicines.  Check your blood pressure, pulse, temperature, and heart rate (vital signs).  Check whether your bag of water (amniotic sac) has broken (ruptured).  Talk with you about your birth plan and discuss pain control options.  Monitoring Your health care provider may monitor your contractions (uterine monitoring) and your baby's heart rate (fetal monitoring). You may need to be monitored:  Often, but not continuously (intermittently).  All the time or for long periods at a time (continuously). Continuous monitoring may be needed if: ? You are taking certain medicines, such as medicine to relieve pain or make your contractions stronger. ? You have pregnancy or labor complications.  Monitoring may be done by:  Placing a special stethoscope or a handheld monitoring device on your abdomen to   check your baby's heartbeat, and feeling your abdomen for contractions. This method of monitoring does not continuously record your baby's heartbeat or your contractions.  Placing monitors on your abdomen (external monitors) to record your baby's heartbeat and the frequency and length of contractions. You may not have to wear external monitors all the time.  Placing monitors inside of your uterus  (internal monitors) to record your baby's heartbeat and the frequency, length, and strength of your contractions. ? Your health care provider may use internal monitors if he or she needs more information about the strength of your contractions or your baby's heart rate. ? Internal monitors are put in place by passing a thin, flexible wire through your vagina and into your uterus. Depending on the type of monitor, it may remain in your uterus or on your baby's head until birth. ? Your health care provider will discuss the benefits and risks of internal monitoring with you and will ask for your permission before inserting the monitors.  Telemetry. This is a type of continuous monitoring that can be done with external or internal monitors. Instead of having to stay in bed, you are able to move around during telemetry. Ask your health care provider if telemetry is an option for you.  Physical exam Your health care provider may perform a physical exam. This may include:  Checking whether your baby is positioned: ? With the head toward your vagina (head-down). This is most common. ? With the head toward the top of your uterus (head-up or breech). If your baby is in a breech position, your health care provider may try to turn your baby to a head-down position so you can deliver vaginally. If it does not seem that your baby can be born vaginally, your provider may recommend surgery to deliver your baby. In rare cases, you may be able to deliver vaginally if your baby is head-up (breech delivery). ? Lying sideways (transverse). Babies that are lying sideways cannot be delivered vaginally.  Checking your cervix to determine: ? Whether it is thinning out (effacing). ? Whether it is opening up (dilating). ? How low your baby has moved into your birth canal.  What are the three stages of labor and delivery?  Normal labor and delivery is divided into the following three stages: Stage 1  Stage 1 is the  longest stage of labor, and it can last for hours or days. Stage 1 includes: ? Early labor. This is when contractions may be irregular, or regular and mild. Generally, early labor contractions are more than 10 minutes apart. ? Active labor. This is when contractions get longer, more regular, more frequent, and more intense. ? The transition phase. This is when contractions happen very close together, are very intense, and may last longer than during any other part of labor.  Contractions generally feel mild, infrequent, and irregular at first. They get stronger, more frequent (about every 2-3 minutes), and more regular as you progress from early labor through active labor and transition.  Many women progress through stage 1 naturally, but you may need help to continue making progress. If this happens, your health care provider may talk with you about: ? Rupturing your amniotic sac if it has not ruptured yet. ? Giving you medicine to help make your contractions stronger and more frequent.  Stage 1 ends when your cervix is completely dilated to 4 inches (10 cm) and completely effaced. This happens at the end of the transition phase. Stage 2  Once   your cervix is completely effaced and dilated to 4 inches (10 cm), you may start to feel an urge to push. It is common for the body to naturally take a rest before feeling the urge to push, especially if you received an epidural or certain other pain medicines. This rest period may last for up to 1-2 hours, depending on your unique labor experience.  During stage 2, contractions are generally less painful, because pushing helps relieve contraction pain. Instead of contraction pain, you may feel stretching and burning pain, especially when the widest part of your baby's head passes through the vaginal opening (crowning).  Your health care provider will closely monitor your pushing progress and your baby's progress through the vagina during stage 2.  Your  health care provider may massage the area of skin between your vaginal opening and anus (perineum) or apply warm compresses to your perineum. This helps it stretch as the baby's head starts to crown, which can help prevent perineal tearing. ? In some cases, an incision may be made in your perineum (episiotomy) to allow the baby to pass through the vaginal opening. An episiotomy helps to make the opening of the vagina larger to allow more room for the baby to fit through.  It is very important to breathe and focus so your health care provider can control the delivery of your baby's head. Your health care provider may have you decrease the intensity of your pushing, to help prevent perineal tearing.  After delivery of your baby's head, the shoulders and the rest of the body generally deliver very quickly and without difficulty.  Once your baby is delivered, the umbilical cord may be cut right away, or this may be delayed for 1-2 minutes, depending on your baby's health. This may vary among health care providers, hospitals, and birth centers.  If you and your baby are healthy enough, your baby may be placed on your chest or abdomen to help maintain the baby's temperature and to help you bond with each other. Some mothers and babies start breastfeeding at this time. Your health care team will dry your baby and help keep your baby warm during this time.  Your baby may need immediate care if he or she: ? Showed signs of distress during labor. ? Has a medical condition. ? Was born too early (prematurely). ? Had a bowel movement before birth (meconium). ? Shows signs of difficulty transitioning from being inside the uterus to being outside of the uterus. If you are planning to breastfeed, your health care team will help you begin a feeding. Stage 3  The third stage of labor starts immediately after the birth of your baby and ends after you deliver the placenta. The placenta is an organ that develops  during pregnancy to provide oxygen and nutrients to your baby in the womb.  Delivering the placenta may require some pushing, and you may have mild contractions. Breastfeeding can stimulate contractions to help you deliver the placenta.  After the placenta is delivered, your uterus should tighten (contract) and become firm. This helps to stop bleeding in your uterus. To help your uterus contract and to control bleeding, your health care provider may: ? Give you medicine by injection, through an IV tube, by mouth, or through your rectum (rectally). ? Massage your abdomen or perform a vaginal exam to remove any blood clots that are left in your uterus. ? Empty your bladder by placing a thin, flexible tube (catheter) into your bladder. ? Encourage   you to breastfeed your baby. After labor is over, you and your baby will be monitored closely to ensure that you are both healthy until you are ready to go home. Your health care team will teach you how to care for yourself and your baby. This information is not intended to replace advice given to you by your health care provider. Make sure you discuss any questions you have with your health care provider. Document Released: 12/28/2007 Document Revised: 10/08/2015 Document Reviewed: 04/04/2015 Elsevier Interactive Patient Education  2018 [employer]  

## 2018-03-10 NOTE — Discharge Summary (Signed)
OB Discharge Summary     Patient Name: Kathleen Cooke DOB: 11/14/1995 MRN: [id number]  Date of admission: 03/08/2018 Delivering MD: Cam Hai D   Date of discharge: 03/10/2018  Admitting diagnosis: 37wks induction Intrauterine pregnancy: [redacted]w[redacted]d     Secondary diagnosis:  Active Problems:   Cholestasis during pregnancy in third trimester  Additional problems:      Discharge diagnosis: Term Pregnancy Delivered                                                                                                Post partum procedures:  Augmentation: AROM and Pitocin  Complications: None  Hospital course:  Induction of Labor With Vaginal Delivery   22 y.o. yo G2P2002 at [redacted]w[redacted]d was admitted to the hospital 03/08/2018 for induction of labor.  Indication for induction: Cholestasis of pregnancy.  Patient had an uncomplicated labor course as follows: Membrane Rupture Time/Date: 6:30 PM ,03/08/2018   Intrapartum Procedures: Episiotomy: None [1]                                         Lacerations:  None [1]  Patient had delivery of a Viable infant.  Information for the patient's newborn:  Destanee, Bedonie [[id number]]  Delivery Method: Vaginal, Spontaneous(Filed from Delivery Summary)   03/08/2018  Details of delivery can be found in separate delivery note.  Patient had a routine postpartum course. Patient is discharged home 03/10/18.  Physical exam  Vitals:   03/09/18 1042 03/09/18 1418 03/09/18 1925 03/10/18 0519  BP: 118/65 110/71 122/86 118/76  Pulse: 60 69 62 (!) 58  Resp: 18 16 18 16   Temp: 98.2 F (36.8 C)   97.8 F (36.6 C)  TempSrc: Oral  Oral Oral  SpO2:  98% 100% 100%  Weight:      Height:       General: alert, cooperative and no distress Lochia: appropriate Uterine Fundus: firm Incision: N/A DVT Evaluation: No evidence of DVT seen on physical exam. Negative Homan's sign. No cords or calf tenderness. Labs: Lab Results  Component Value Date   WBC 12.7 (H)  03/08/2018   HGB 9.9 (L) 03/08/2018   HCT 31.8 (L) 03/08/2018   MCV 79.7 (L) 03/08/2018   PLT 305 03/08/2018   CMP Latest Ref Rng & Units 02/21/2018  Glucose 65 - 99 mg/dL 782(N)  BUN 6 - 20 mg/dL 5(L)  Creatinine 5.62 - 1.00 mg/dL 1.30(Q)  Sodium 657 - 846 mmol/L 140  Potassium 3.5 - 5.2 mmol/L 3.8  Chloride 96 - 106 mmol/L 106  CO2 20 - 29 mmol/L 18(L)  Calcium 8.7 - 10.2 mg/dL 7.9(L)  Total Protein 6.0 - 8.5 g/dL 5.9(L)  Total Bilirubin 0.0 - 1.2 mg/dL 0.2  Alkaline Phos 39 - 117 IU/L 275(H)  AST 0 - 40 IU/L 58(H)  ALT 0 - 32 IU/L 77(H)    Discharge instruction: per After Visit Summary and "Baby and Me Booklet".  After visit meds:  Allergies as of 03/10/2018   No  Known Allergies     Medication List    STOP taking these medications   albuterol 108 (90 Base) MCG/ACT inhaler Commonly known as:  PROVENTIL HFA;VENTOLIN HFA   diphenhydrAMINE 25 MG tablet Commonly known as:  BENADRYL   ferrous gluconate 324 MG tablet Commonly known as:  FERGON   ondansetron 4 MG disintegrating tablet Commonly known as:  ZOFRAN-ODT   PRENATAL VITAMIN PO   ursodiol 500 MG tablet Commonly known as:  ACTIGALL     TAKE these medications   ibuprofen 600 MG tablet Commonly known as:  ADVIL,MOTRIN Take 1 tablet (600 mg total) by mouth every 6 (six) hours.       Diet: routine diet  Activity: Advance as tolerated. Pelvic rest for 6 weeks.   Outpatient follow up:4 weeks Follow up Appt:No future appointments. Follow up Visit:No follow-ups on file.  Postpartum contraception: Depo Provera  Newborn Data: Live born female  Birth Weight: 6 lb 7.5 oz (2934 g) APGAR: 9, 9  Newborn Delivery   Birth date/time:  03/08/2018 23:12:00 Delivery type:  Vaginal, Spontaneous     Baby Feeding: Bottle Disposition:home with mother   03/10/2018 Jacklyn ShellFrances Cresenzo-Dishmon, CNM

## 2018-03-12 ENCOUNTER — Telehealth: Payer: Self-pay | Admitting: Family Medicine

## 2018-03-12 NOTE — Telephone Encounter (Signed)
Called pt to get her scheduled for postpart visit. No answer and LVM with appt time and date. Also mailed appt reminder letter.

## 2018-04-03 NOTE — L&D Delivery Note (Signed)
Patient was C/C/+3 and pushed for <1 minutes with epidural.    NSVD female infant, Apgars 9,9, weight P.   The patient had no lacerations. Fundus was firm. EBL was expected amount. Placenta was delivered intact. Vagina was clear.  Delayed cord clamping done for 30-60 seconds while warming baby. Baby was vigorous and doing skin to skin with mother.  Kathleen Cooke

## 2018-04-09 ENCOUNTER — Encounter: Payer: Self-pay | Admitting: *Deleted

## 2018-04-10 ENCOUNTER — Encounter: Payer: Self-pay | Admitting: Obstetrics & Gynecology

## 2018-04-10 ENCOUNTER — Ambulatory Visit (INDEPENDENT_AMBULATORY_CARE_PROVIDER_SITE_OTHER): Payer: Medicaid Other | Admitting: Obstetrics & Gynecology

## 2018-04-10 VITALS — BP 112/74 | HR 86 | Wt 127.7 lb

## 2018-04-10 DIAGNOSIS — K831 Obstruction of bile duct: Secondary | ICD-10-CM

## 2018-04-10 DIAGNOSIS — O26613 Liver and biliary tract disorders in pregnancy, third trimester: Secondary | ICD-10-CM

## 2018-04-10 LAB — POCT PREGNANCY, URINE: Preg Test, Ur: NEGATIVE

## 2018-04-10 NOTE — Progress Notes (Deleted)
Subjective:     Kathleen Cooke is a 23 y.o. female who presents for a postpartum visit. She is 4 weeks postpartum following a spontaneous vaginal delivery. I have fully reviewed the prenatal and intrapartum course. The delivery was at 37 gestational weeks, IOL for cholestasis of pregnancy. Outcome: spontaneous vaginal delivery. Anesthesia: epidural. Postpartum course has been good. Baby's course has been good. Baby is feeding by both breast and bottle - [place] Good Start. Bleeding no bleeding. Bowel function is normal. Bladder function is normal. Patient is sexually active, as recently as 04/01/18. Contraception method is Depo-Provera injections. Postpartum depression screening: negative.  The following portions of the patient's history were reviewed and updated as appropriate: allergies, current medications, past family history, past medical history, past social history, past surgical history and problem list.  Review of Systems Pertinent items are noted in HPI.   Objective:    There were no vitals taken for this visit.  General:  alert   Breasts:  inspection negative, no nipple discharge or bleeding, no masses or nodularity palpable  Lungs: clear to auscultation bilaterally  Heart:  regular rate and rhythm, S1, S2 normal, no murmur, click, rub or gallop  Abdomen: soft, non-tender; bowel sounds normal; no masses,  no organomegaly   Vulva:  not evaluated  Vagina: not evaluated  Cervix:  not evaluated  Corpus: not examined  Adnexa:  not evaluated  Rectal Exam: Not performed.        Assessment:     Normal postpartum exam. Pap smear {done:10129} at today's visit.   Plan:    1. Contraception: abstinence until next week when she can get depo provera if her UPT is negative. - check bile salts today

## 2018-04-11 LAB — BILE ACIDS, TOTAL: Bile Acids Total: 6.6 umol/L (ref 0.0–10.0)

## 2018-04-17 ENCOUNTER — Ambulatory Visit: Payer: Medicaid Other

## 2018-04-24 ENCOUNTER — Encounter: Payer: Self-pay | Admitting: Family Medicine

## 2018-05-22 ENCOUNTER — Encounter (HOSPITAL_COMMUNITY): Payer: Self-pay | Admitting: Emergency Medicine

## 2018-05-22 ENCOUNTER — Other Ambulatory Visit: Payer: Self-pay

## 2018-05-22 ENCOUNTER — Emergency Department (HOSPITAL_COMMUNITY): Payer: Medicaid Other

## 2018-05-22 ENCOUNTER — Emergency Department (HOSPITAL_COMMUNITY)
Admission: EM | Admit: 2018-05-22 | Discharge: 2018-05-22 | Disposition: A | Discharge status: Home / Self Care | Payer: Medicaid Other | Attending: Emergency Medicine | Admitting: Emergency Medicine

## 2018-05-22 DIAGNOSIS — J45909 Unspecified asthma, uncomplicated: Secondary | ICD-10-CM | POA: Diagnosis not present

## 2018-05-22 DIAGNOSIS — R1032 Left lower quadrant pain: Secondary | ICD-10-CM | POA: Diagnosis not present

## 2018-05-22 DIAGNOSIS — R102 Pelvic and perineal pain: Secondary | ICD-10-CM

## 2018-05-22 LAB — COMPREHENSIVE METABOLIC PANEL
ALT: 16 U/L (ref 0–44)
AST: 20 U/L (ref 15–41)
Albumin: 3.4 g/dL — ABNORMAL LOW (ref 3.5–5.0)
Alkaline Phosphatase: 84 U/L (ref 38–126)
Anion gap: 9 (ref 5–15)
BUN: 14 mg/dL (ref 6–20)
CO2: 23 mmol/L (ref 22–32)
Calcium: 8.8 mg/dL — ABNORMAL LOW (ref 8.9–10.3)
Chloride: 105 mmol/L (ref 98–111)
Creatinine, Ser: 0.53 mg/dL (ref 0.44–1.00)
GFR calc Af Amer: >60 mL/min (ref >60)
GFR calc non Af Amer: >60 mL/min (ref >60)
Glucose, Bld: 95 mg/dL (ref 70–99)
Potassium: 3.6 mmol/L (ref 3.5–5.1)
Sodium: 137 mmol/L (ref 135–145)
Total Bilirubin: 0.4 mg/dL (ref 0.3–1.2)
Total Protein: 6.7 g/dL (ref 6.5–8.1)

## 2018-05-22 LAB — URINALYSIS, ROUTINE W REFLEX MICROSCOPIC
Bilirubin Urine: NEGATIVE
Glucose, UA: NEGATIVE mg/dL
Ketones, ur: NEGATIVE mg/dL
Nitrite: NEGATIVE
Protein, ur: NEGATIVE mg/dL
Specific Gravity, Urine: 1.021 (ref 1.005–1.030)
pH: 7 (ref 5.0–8.0)

## 2018-05-22 LAB — CBC
HCT: 33.9 % — ABNORMAL LOW (ref 36.0–46.0)
Hemoglobin: 10.3 g/dL — ABNORMAL LOW (ref 12.0–15.0)
MCH: 24.6 pg — ABNORMAL LOW (ref 26.0–34.0)
MCHC: 30.4 g/dL (ref 30.0–36.0)
MCV: 81.1 fL (ref 80.0–100.0)
Platelets: 293 10*3/uL (ref 150–400)
RBC: 4.18 MIL/uL (ref 3.87–5.11)
RDW: 15.9 % — ABNORMAL HIGH (ref 11.5–15.5)
WBC: 11.7 10*3/uL — ABNORMAL HIGH (ref 4.0–10.5)
nRBC: 0 % (ref 0.0–0.2)

## 2018-05-22 LAB — WET PREP, GENITAL
Clue Cells Wet Prep HPF POC: NONE SEEN
Sperm: NONE SEEN
Trich, Wet Prep: NONE SEEN
Yeast Wet Prep HPF POC: NONE SEEN

## 2018-05-22 LAB — I-STAT BETA HCG BLOOD, ED (MC, WL, AP ONLY): I-stat hCG, quantitative: <5 m[IU]/mL (ref <5)

## 2018-05-22 LAB — LIPASE, BLOOD: Lipase: 37 U/L (ref 11–51)

## 2018-05-22 MED ORDER — CEFTRIAXONE SODIUM 250 MG IJ SOLR
250.0000 mg | Freq: Once | INTRAMUSCULAR | Status: AC
  Administered 2018-05-22: 250 mg via INTRAMUSCULAR
  Filled 2018-05-22: qty 250

## 2018-05-22 MED ORDER — OXYCODONE-ACETAMINOPHEN 5-325 MG PO TABS
2.0000 | ORAL_TABLET | Freq: Once | ORAL | Status: AC
  Administered 2018-05-22: 2 via ORAL
  Filled 2018-05-22: qty 2

## 2018-05-22 MED ORDER — LIDOCAINE HCL (PF) 1 % IJ SOLN
INTRAMUSCULAR | Status: AC
  Filled 2018-05-22: qty 5

## 2018-05-22 MED ORDER — CEPHALEXIN 500 MG PO CAPS: 500.0000 mg | ORAL_CAPSULE | Freq: Four times a day (QID) | ORAL | 0 refills | Status: DC

## 2018-05-22 MED ORDER — AZITHROMYCIN 1 G PO PACK
1.0000 g | PACK | Freq: Once | ORAL | Status: AC
  Administered 2018-05-22: 1 g via ORAL
  Filled 2018-05-22: qty 1

## 2018-05-22 MED ORDER — HYDROCODONE-ACETAMINOPHEN 5-325 MG PO TABS: 2.0000 | ORAL_TABLET | ORAL | 0 refills | Status: DC | PRN

## 2018-05-22 NOTE — ED Triage Notes (Addendum)
Pt in w/sharp LLQ abd pain since last night, radiates to R abd tender on palpation. Denies n/v/d or vaginal bleeding, states normal BM's. LMP 1/30, states painful when urinating.

## 2018-05-22 NOTE — ED Provider Notes (Signed)
[facility] EMERGENCY DEPARTMENT Provider Note   CSN: [id number] Arrival date & time: 05/22/18  0356    History   Chief Complaint Chief Complaint  Patient presents with  . Abdominal Pain    HPI Kathleen Cooke is a 23 y.o. female.     23 year old female who delivered a child 2 months ago presents with sudden onset of left lower quadrant abdominal pain which began 24 hours ago.  No associated vaginal bleeding or discharge.  Some dysuria.  No flank pain noted.  No vomiting or diarrhea.  Pain starts in her left lower quadrant and characterizes sharp and radiates to the right side.  Symptoms better with remaining still and worse with movement.  No treatment use prior to arrival.  No prior history of same.     Past Medical History:  Diagnosis Date  . Asthma   . Headache     Patient Active Problem List   Diagnosis Date Noted  . 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 Cholestasis during pregnancy 02/25/2018  . Elevated transaminase level 02/21/2018  . Asthma 01/16/2018  . Abnormal genetic test 11/06/2017  . Abnormal MSAFP (maternal serum alpha-fetoprotein), elevated 11/06/2017  . Anemia 11/06/2017  . Supervision of other normal pregnancy, antepartum 10/25/2017    Past Surgical History:  Procedure Laterality Date  . NO PAST SURGERIES       OB History    Gravida  2   Para  2   Term  2   Preterm  0   AB  0   Living  2     SAB  0   TAB  0   Ectopic  0   Multiple  0   Live Births  2            Home Medications    Prior to Admission medications   Medication Sig Start Date End Date Taking? Authorizing Provider  ibuprofen (ADVIL,MOTRIN) 600 MG tablet Take 1 tablet (600 mg total) by mouth every 6 (six) hours. Patient not taking: Reported on 04/10/2018 03/10/18   Jacklyn Shell, CNM    Family History Family History  Problem Relation Age of Onset  . Birth defects Neg Hx   . Diabetes Neg Hx   .  Early death Neg Hx   . Heart disease Neg Hx   . Hypertension Neg Hx   . Stroke Neg Hx   . Miscarriages / Stillbirths Neg Hx     Social History Social History   Tobacco Use  . Smoking status: Never Smoker  . Smokeless tobacco: Never Used  Substance Use Topics  . Alcohol use: No  . Drug use: Not Currently    Types: Marijuana    Comment: used cocaine 1.5 yrs ago      Allergies   Patient has no known allergies.   Review of Systems Review of Systems  All other systems reviewed and are negative.    Physical Exam Updated Vital Signs BP 106/74 (BP Location: Right Arm)   Pulse 99   Temp 99 F (37.2 C) (Oral)   Resp 18   Wt 59 kg   LMP 05/02/2018   SpO2 99%   Breastfeeding Yes   BMI 23.78 kg/m   Physical Exam Vitals signs and nursing note reviewed.  Constitutional:      General: She is not in acute distress.    Appearance: Normal appearance. She is well-developed. She is not toxic-appearing.  HENT:     Head:  Normocephalic and atraumatic.  Eyes:     General: Lids are normal.     Conjunctiva/sclera: Conjunctivae normal.     Pupils: Pupils are equal, round, and reactive to light.  Neck:     Musculoskeletal: Normal range of motion and neck supple.     Thyroid: No thyroid mass.     Trachea: No tracheal deviation.  Cardiovascular:     Rate and Rhythm: Normal rate and regular rhythm.     Heart sounds: Normal heart sounds. No murmur. No gallop.   Pulmonary:     Effort: Pulmonary effort is normal. No respiratory distress.     Breath sounds: Normal breath sounds. No stridor. No decreased breath sounds, wheezing, rhonchi or rales.  Abdominal:     General: Bowel sounds are normal. There is no distension.     Palpations: Abdomen is soft.     Tenderness: There is abdominal tenderness in the left lower quadrant. There is guarding. There is no rebound.    Genitourinary:    Cervix: Discharge present. No cervical bleeding.     Comments: Patient refused digital exam due  to pain Musculoskeletal: Normal range of motion.        General: No tenderness.  Skin:    General: Skin is warm and dry.     Findings: No abrasion or rash.  Neurological:     Mental Status: She is alert and oriented to person, place, and time.     GCS: GCS eye subscore is 4. GCS verbal subscore is 5. GCS motor subscore is 6.     Cranial Nerves: No cranial nerve deficit.     Sensory: No sensory deficit.  Psychiatric:        Speech: Speech normal.        Behavior: Behavior normal.      ED Treatments / Results  Labs (all labs ordered are listed, but only abnormal results are displayed) Labs Reviewed  COMPREHENSIVE METABOLIC PANEL - Abnormal; Notable for the following components:      Result Value   Calcium 8.8 (*)    Albumin 3.4 (*)    All other components within normal limits  CBC - Abnormal; Notable for the following components:   WBC 11.7 (*)    Hemoglobin 10.3 (*)    HCT 33.9 (*)    MCH 24.6 (*)    RDW 15.9 (*)    All other components within normal limits  WET PREP, GENITAL  LIPASE, BLOOD  URINALYSIS, ROUTINE W REFLEX MICROSCOPIC  I-STAT BETA HCG BLOOD, ED ([facility], [facility], [facility] ONLY)  GC/CHLAMYDIA PROBE AMP ([place]) NOT AT [facility]    EKG None  Radiology No results found.  Procedures Procedures (including critical care time)  Medications Ordered in ED Medications  oxyCODONE-acetaminophen (PERCOCET/ROXICET) 5-325 MG per tablet 2 tablet (has no administration in time range)     Initial Impression / Assessment and Plan / ED Course  I have reviewed the triage vital signs and the nursing notes.  Pertinent labs & imaging results that were available during my care of the patient were reviewed by me and considered in my medical decision making (see chart for details).        Patient will be treated empirically for PID as she does have copious vaginal discharge.  She will have a urine GC chlamydia sent.  Patient's pelvic ultrasound was without acute findings.  Will  treat for UTI as well and discharged home  Final Clinical Impressions(s) / ED Diagnoses   Final  diagnoses:  None    ED Discharge Orders    None       Lorre NickAllen, Teesha Ohm, MD 05/22/18 1058

## 2018-05-22 NOTE — ED Notes (Signed)
Patient verbalizes understanding of discharge instructions. Opportunity for questioning and answers were provided. Armband removed by staff, pt discharged from ED.  

## 2018-05-22 NOTE — ED Notes (Signed)
Patient transported to Ultrasound 

## 2018-05-24 LAB — GC/CHLAMYDIA PROBE AMP (~~LOC~~) NOT AT ARMC
Chlamydia: NEGATIVE
Chlamydia: NEGATIVE
Neisseria Gonorrhea: NEGATIVE
Neisseria Gonorrhea: NEGATIVE

## 2018-06-04 NOTE — Progress Notes (Signed)
   Subjective:    Patient ID: Kathleen Cooke, female    DOB: 02/21/1996, 23 y.o.   MRN: [id number]  HPI 23 yo P2 is here for a postpartum visit after a NSVD at 37 weeks following an IOL for cholestasis in pregnancy. She is doing well and denies any further itching or other problems. She wants to use condoms for contraception.    Review of Systems     Objective:   Physical Exam Breathing, conversing, and ambulating normally Well nourished, well hydrated Latina, no apparent distress  Abd- benign       Assessment & Plan:  Postpartum- doing well She will need a pap smear again in 2022.

## 2018-09-17 LAB — OB RESULTS CONSOLE HEPATITIS B SURFACE ANTIGEN: Hepatitis B Surface Ag: NEGATIVE

## 2018-09-17 LAB — OB RESULTS CONSOLE HIV ANTIBODY (ROUTINE TESTING): HIV: NONREACTIVE

## 2018-09-17 LAB — OB RESULTS CONSOLE RUBELLA ANTIBODY, IGM: Rubella: IMMUNE

## 2018-10-15 ENCOUNTER — Telehealth: Payer: Self-pay

## 2018-10-15 DIAGNOSIS — Z20822 Contact with and (suspected) exposure to covid-19: Secondary | ICD-10-CM

## 2018-10-15 NOTE — Telephone Encounter (Signed)
Out going call to patient to schedule for Covid testing.  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  Tomorrow.  They offered to test her children also.

## 2018-10-15 NOTE — Telephone Encounter (Addendum)
Patient called, left VM to return the call to schedule covid testing to [phone] between 0700-1900 Monday-Friday. Order placed.   ----- Message from Theodis Sato, MD sent at 10/15/2018 11:02 AM EDT ----- Regarding: COVID 19 testing Please schedule for COVID testing today if possible

## 2018-10-16 ENCOUNTER — Other Ambulatory Visit: Payer: Self-pay

## 2018-10-16 ENCOUNTER — Encounter (HOSPITAL_COMMUNITY): Payer: Self-pay | Admitting: Emergency Medicine

## 2018-10-16 ENCOUNTER — Ambulatory Visit (HOSPITAL_COMMUNITY)
Admission: EM | Admit: 2018-10-16 | Discharge: 2018-10-16 | Disposition: A | Discharge status: Home / Self Care | Payer: Medicaid Other | Attending: Family Medicine | Admitting: Family Medicine

## 2018-10-16 DIAGNOSIS — J029 Acute pharyngitis, unspecified: Secondary | ICD-10-CM | POA: Diagnosis not present

## 2018-10-16 DIAGNOSIS — U071 COVID-19: Secondary | ICD-10-CM | POA: Diagnosis not present

## 2018-10-16 DIAGNOSIS — R0981 Nasal congestion: Secondary | ICD-10-CM | POA: Diagnosis not present

## 2018-10-16 DIAGNOSIS — Z20822 Contact with and (suspected) exposure to covid-19: Secondary | ICD-10-CM

## 2018-10-16 DIAGNOSIS — M791 Myalgia, unspecified site: Secondary | ICD-10-CM

## 2018-10-16 DIAGNOSIS — Z20828 Contact with and (suspected) exposure to other viral communicable diseases: Secondary | ICD-10-CM

## 2018-10-16 DIAGNOSIS — R51 Headache: Secondary | ICD-10-CM | POA: Diagnosis present

## 2018-10-16 NOTE — Discharge Instructions (Addendum)
We tested you for COVID today It may take about 3 or more days for the results Make sure you stay quarantined and take precautions until we get your results You can take over-the-counter medicine as needed for symptoms Follow up as needed for continued or worsening symptoms

## 2018-10-16 NOTE — ED Provider Notes (Signed)
[facility]    CSN: [id number] Arrival date & time: 10/16/18  1054     History   Chief Complaint Chief Complaint  Patient presents with  . Nasal Congestion  . Headache    HPI Kathleen Cooke is a 23 y.o. female.   Patient is a 23 year old female with past medical history of asthma, headache.  She presents today with nasal congestion, rhinorrhea, headache, body aches, mild sore throat.  Symptoms have been constant, waxing waning for the past 5 days.  She has not taken anything for her symptoms.  Denies any associated fevers, cough or chest congestion.  Daughter and son are sick with similar, viral type symptoms.  Concern for COVID.  Denies any recent traveling or COVID exposures.  Denies any chest pain, shortness of breath.  Patient also complaining of loss of taste and smell.  ROS per HPI      Past Medical History:  Diagnosis Date  . Asthma   . Headache     Patient Active Problem List   Diagnosis Date Noted  . 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 Cholestasis during pregnancy 02/25/2018  . Elevated transaminase level 02/21/2018  . Asthma 01/16/2018  . Abnormal genetic test 11/06/2017  . Abnormal MSAFP (maternal serum alpha-fetoprotein), elevated 11/06/2017  . Anemia 11/06/2017  . Supervision of other normal pregnancy, antepartum 10/25/2017    Past Surgical History:  Procedure Laterality Date  . NO PAST SURGERIES      OB History    Gravida  3   Para  2   Term  2   Preterm  0   AB  0   Living  2     SAB  0   TAB  0   Ectopic  0   Multiple  0   Live Births  2            Home Medications    Prior to Admission medications   Not on File    Family History Family History  Problem Relation Age of Onset  . Birth defects Neg Hx   . Diabetes Neg Hx   . Early death Neg Hx   . Heart disease Neg Hx   . Hypertension Neg Hx   . Stroke Neg Hx   . Miscarriages / Stillbirths Neg Hx     Social History  Social History   Tobacco Use  . Smoking status: Never Smoker  . Smokeless tobacco: Never Used  Substance Use Topics  . Alcohol use: No  . Drug use: Not Currently    Types: Marijuana    Comment: used cocaine 1.5 yrs ago      Allergies   Patient has no known allergies.   Review of Systems Review of Systems   Physical Exam Triage Vital Signs ED Triage Vitals  Enc Vitals Group     BP 10/16/18 1147 105/68     Pulse Rate 10/16/18 1147 90     Resp 10/16/18 1147 16     Temp 10/16/18 1147 97.8 F (36.6 C)     Temp Source 10/16/18 1147 Oral     SpO2 10/16/18 1147 99 %     Weight --      Height --      Head Circumference --      Peak Flow --      Pain Score 10/16/18 1148 2     Pain Loc --      Pain Edu? --  Excl. in GC? --    No data found.  Updated Vital Signs BP 105/68   Pulse 90   Temp 97.8 F (36.6 C) (Oral)   Resp 16   LMP 05/02/2018   SpO2 99%   Visual Acuity Right Eye Distance:   Left Eye Distance:   Bilateral Distance:    Right Eye Near:   Left Eye Near:    Bilateral Near:     Physical Exam Vitals signs and nursing note reviewed.  Constitutional:      General: She is not in acute distress.    Appearance: Normal appearance. She is well-developed. She is not ill-appearing, toxic-appearing or diaphoretic.  HENT:     Head: Normocephalic and atraumatic.     Right Ear: Tympanic membrane normal.     Left Ear: Tympanic membrane and ear canal normal.     Nose: Congestion present.     Mouth/Throat:     Mouth: Mucous membranes are moist.     Pharynx: Oropharynx is clear.  Eyes:     Conjunctiva/sclera: Conjunctivae normal.  Neck:     Musculoskeletal: Normal range of motion.  Cardiovascular:     Rate and Rhythm: Normal rate and regular rhythm.     Heart sounds: Normal heart sounds.  Pulmonary:     Effort: Pulmonary effort is normal.     Breath sounds: Normal breath sounds.  Musculoskeletal: Normal range of motion.  Lymphadenopathy:      Cervical: No cervical adenopathy.  Skin:    General: Skin is warm and dry.  Neurological:     Mental Status: She is alert.  Psychiatric:        Mood and Affect: Mood normal.      UC Treatments / Results  Labs (all labs ordered are listed, but only abnormal results are displayed) Labs Reviewed  NOVEL CORONAVIRUS, NAA    EKG   Radiology No results found.  Procedures Procedures (including critical care time)  Medications Ordered in UC Medications - No data to display  Initial Impression / Assessment and Plan / UC Course  I have reviewed the triage vital signs and the nursing notes.  Pertinent labs & imaging results that were available during my care of the patient were reviewed by me and considered in my medical decision making (see chart for details).     Symptoms and exam consistent with some sort of viral illness.  High suspicion for COVID at this time. Testing for COVID Precautions given. She is nontoxic or ill-appearing and her vital signs are stable. Recommend over-the-counter medication as needed for symptoms If she starts developing any worsening symptoms or shortness of breath she will need to go to the ER. Final Clinical Impressions(s) / UC Diagnoses   Final diagnoses:  Suspected Covid-19 Virus Infection     Discharge Instructions     We tested you for COVID today It may take about 3 or more days for the results Make sure you stay quarantined and take precautions until we get your results You can take over-the-counter medicine as needed for symptoms Follow up as needed for continued or worsening symptoms     ED Prescriptions    None     Controlled Substance Prescriptions [place] Controlled Substance Registry consulted? Not Applicable   Orvan July, NP 10/16/18 1300

## 2018-10-16 NOTE — ED Triage Notes (Signed)
PT reports headache, body ache, sore throat since Saturday.

## 2018-10-18 LAB — NOVEL CORONAVIRUS, NAA (HOSP ORDER, SEND-OUT TO REF LAB; TAT 18-24 HRS): SARS-CoV-2, NAA: DETECTED — AB

## 2018-10-21 ENCOUNTER — Telehealth (HOSPITAL_COMMUNITY): Payer: Self-pay | Admitting: Emergency Medicine

## 2018-10-21 NOTE — Telephone Encounter (Signed)
Your test for COVID-19 was negative.  Please continue good preventive care measures, including:  frequent hand-washing, avoid touching your face, cover coughs/sneezes, stay out of crowds and keep a 6 foot distance from others.  If you develop fever/cough/breathlessness, please stay home for 10 days and until you have had 3 consecutive days with cough/breathlessness improving and without fever (without taking a fever reducer). Go to the nearest hospital ED tent for assessment if fever/cough/breathlessness are severe or illness seems like a threat to life.  Pt was aware, pt infant was admitted last week for covid.

## 2019-02-14 LAB — OB RESULTS CONSOLE GBS: GBS: NEGATIVE

## 2019-03-07 ENCOUNTER — Other Ambulatory Visit: Payer: Self-pay | Admitting: Obstetrics and Gynecology

## 2019-03-08 ENCOUNTER — Inpatient Hospital Stay (HOSPITAL_COMMUNITY): Payer: Medicaid Other | Admitting: Anesthesiology

## 2019-03-08 ENCOUNTER — Encounter (HOSPITAL_COMMUNITY): Payer: Self-pay | Admitting: *Deleted

## 2019-03-08 ENCOUNTER — Other Ambulatory Visit: Payer: Self-pay

## 2019-03-08 ENCOUNTER — Inpatient Hospital Stay (HOSPITAL_COMMUNITY)
Admission: AD | Admit: 2019-03-08 | Discharge: 2019-03-09 | DRG: 807 | Disposition: A | Discharge status: Home / Self Care | Payer: Medicaid Other | Attending: Obstetrics and Gynecology | Admitting: Obstetrics and Gynecology

## 2019-03-08 DIAGNOSIS — O9952 Diseases of the respiratory system complicating childbirth: Secondary | ICD-10-CM | POA: Diagnosis present

## 2019-03-08 DIAGNOSIS — Z3A39 39 weeks gestation of pregnancy: Secondary | ICD-10-CM

## 2019-03-08 DIAGNOSIS — J45909 Unspecified asthma, uncomplicated: Secondary | ICD-10-CM | POA: Diagnosis present

## 2019-03-08 DIAGNOSIS — Z20828 Contact with and (suspected) exposure to other viral communicable diseases: Secondary | ICD-10-CM | POA: Diagnosis present

## 2019-03-08 DIAGNOSIS — O26893 Other specified pregnancy related conditions, third trimester: Secondary | ICD-10-CM | POA: Diagnosis present

## 2019-03-08 LAB — TYPE AND SCREEN
ABO/RH(D): O POS
Antibody Screen: NEGATIVE

## 2019-03-08 LAB — CBC
HCT: 34.8 % — ABNORMAL LOW (ref 36.0–46.0)
Hemoglobin: 11.1 g/dL — ABNORMAL LOW (ref 12.0–15.0)
MCH: 26.2 pg (ref 26.0–34.0)
MCHC: 31.9 g/dL (ref 30.0–36.0)
MCV: 82.1 fL (ref 80.0–100.0)
Platelets: 282 10*3/uL (ref 150–400)
RBC: 4.24 MIL/uL (ref 3.87–5.11)
RDW: 17.1 % — ABNORMAL HIGH (ref 11.5–15.5)
WBC: 9.1 10*3/uL (ref 4.0–10.5)
nRBC: 0 % (ref 0.0–0.2)

## 2019-03-08 LAB — SARS CORONAVIRUS 2 BY RT PCR (HOSPITAL ORDER, PERFORMED IN ~~LOC~~ HOSPITAL LAB): SARS Coronavirus 2: NEGATIVE

## 2019-03-08 LAB — RPR: RPR Ser Ql: NONREACTIVE

## 2019-03-08 LAB — ABO/RH: ABO/RH(D): O POS

## 2019-03-08 MED ORDER — IBUPROFEN 800 MG PO TABS
800.0000 mg | ORAL_TABLET | Freq: Three times a day (TID) | ORAL | Status: DC
  Administered 2019-03-08 – 2019-03-09 (×3): 800 mg via ORAL
  Filled 2019-03-08 (×3): qty 1

## 2019-03-08 MED ORDER — COCONUT OIL OIL: 1.0000 "application " | TOPICAL_OIL | Status: DC | PRN

## 2019-03-08 MED ORDER — MEASLES, MUMPS & RUBELLA VAC IJ SOLR: 0.5000 mL | Freq: Once | INTRAMUSCULAR | Status: DC

## 2019-03-08 MED ORDER — ZOLPIDEM TARTRATE 5 MG PO TABS: 5.0000 mg | ORAL_TABLET | Freq: Every evening | ORAL | Status: DC | PRN

## 2019-03-08 MED ORDER — PRENATAL MULTIVITAMIN CH
1.0000 | ORAL_TABLET | Freq: Every day | ORAL | Status: DC
  Administered 2019-03-09: 1 via ORAL
  Filled 2019-03-08: qty 1

## 2019-03-08 MED ORDER — SODIUM CHLORIDE (PF) 0.9 % IJ SOLN
INTRAMUSCULAR | Status: DC | PRN
  Administered 2019-03-08: 12 mL/h via EPIDURAL

## 2019-03-08 MED ORDER — SENNOSIDES-DOCUSATE SODIUM 8.6-50 MG PO TABS
2.0000 | ORAL_TABLET | ORAL | Status: DC
  Administered 2019-03-08: 2 via ORAL
  Filled 2019-03-08: qty 2

## 2019-03-08 MED ORDER — OXYCODONE-ACETAMINOPHEN 5-325 MG PO TABS: 1.0000 | ORAL_TABLET | ORAL | Status: DC | PRN

## 2019-03-08 MED ORDER — SIMETHICONE 80 MG PO CHEW: 80.0000 mg | CHEWABLE_TABLET | ORAL | Status: DC | PRN

## 2019-03-08 MED ORDER — MAGNESIUM HYDROXIDE 400 MG/5ML PO SUSP: 30.0000 mL | ORAL | Status: DC | PRN

## 2019-03-08 MED ORDER — DIPHENHYDRAMINE HCL 25 MG PO CAPS: 25.0000 mg | ORAL_CAPSULE | Freq: Four times a day (QID) | ORAL | Status: DC | PRN

## 2019-03-08 MED ORDER — ONDANSETRON HCL 4 MG PO TABS: 4.0000 mg | ORAL_TABLET | ORAL | Status: DC | PRN

## 2019-03-08 MED ORDER — WITCH HAZEL-GLYCERIN EX PADS: 1.0000 "application " | MEDICATED_PAD | CUTANEOUS | Status: DC | PRN

## 2019-03-08 MED ORDER — FLEET ENEMA 7-19 GM/118ML RE ENEM: 1.0000 | ENEMA | RECTAL | Status: DC | PRN

## 2019-03-08 MED ORDER — DIPHENHYDRAMINE HCL 50 MG/ML IJ SOLN: 12.5000 mg | INTRAMUSCULAR | Status: DC | PRN

## 2019-03-08 MED ORDER — DIBUCAINE (PERIANAL) 1 % EX OINT: 1.0000 "application " | TOPICAL_OINTMENT | CUTANEOUS | Status: DC | PRN

## 2019-03-08 MED ORDER — OXYCODONE-ACETAMINOPHEN 5-325 MG PO TABS
2.0000 | ORAL_TABLET | ORAL | Status: DC | PRN
  Administered 2019-03-08: 2 via ORAL
  Administered 2019-03-09: 1 via ORAL
  Administered 2019-03-09: 2 via ORAL
  Filled 2019-03-08 (×3): qty 2

## 2019-03-08 MED ORDER — EPHEDRINE 5 MG/ML INJ: 10.0000 mg | INTRAVENOUS | Status: DC | PRN

## 2019-03-08 MED ORDER — OXYTOCIN 40 UNITS IN NORMAL SALINE INFUSION - SIMPLE MED
2.5000 [IU]/h | INTRAVENOUS | Status: DC
  Filled 2019-03-08: qty 1000

## 2019-03-08 MED ORDER — OXYCODONE-ACETAMINOPHEN 5-325 MG PO TABS: 2.0000 | ORAL_TABLET | ORAL | Status: DC | PRN

## 2019-03-08 MED ORDER — TETANUS-DIPHTH-ACELL PERTUSSIS 5-2.5-18.5 LF-MCG/0.5 IM SUSP: 0.5000 mL | Freq: Once | INTRAMUSCULAR | Status: DC

## 2019-03-08 MED ORDER — FERROUS SULFATE 325 (65 FE) MG PO TABS
325.0000 mg | ORAL_TABLET | Freq: Two times a day (BID) | ORAL | Status: DC
  Administered 2019-03-08: 325 mg via ORAL
  Filled 2019-03-08: qty 1

## 2019-03-08 MED ORDER — ACETAMINOPHEN 325 MG PO TABS: 650.0000 mg | ORAL_TABLET | ORAL | Status: DC | PRN

## 2019-03-08 MED ORDER — METHYLERGONOVINE MALEATE 0.2 MG/ML IJ SOLN
0.2000 mg | Freq: Once | INTRAMUSCULAR | Status: AC
  Administered 2019-03-08: 0.2 mg via INTRAMUSCULAR

## 2019-03-08 MED ORDER — BENZOCAINE-MENTHOL 20-0.5 % EX AERO
1.0000 "application " | INHALATION_SPRAY | CUTANEOUS | Status: DC | PRN
  Administered 2019-03-08: 1 via TOPICAL

## 2019-03-08 MED ORDER — OXYTOCIN 40 UNITS IN NORMAL SALINE INFUSION - SIMPLE MED
1.0000 m[IU]/min | INTRAVENOUS | Status: DC
  Administered 2019-03-08: 2 m[IU]/min via INTRAVENOUS

## 2019-03-08 MED ORDER — ONDANSETRON HCL 4 MG/2ML IJ SOLN: 4.0000 mg | Freq: Four times a day (QID) | INTRAMUSCULAR | Status: DC | PRN

## 2019-03-08 MED ORDER — SOD CITRATE-CITRIC ACID 500-334 MG/5ML PO SOLN: 30.0000 mL | ORAL | Status: DC | PRN

## 2019-03-08 MED ORDER — OXYTOCIN BOLUS FROM INFUSION
500.0000 mL | Freq: Once | INTRAVENOUS | Status: AC
  Administered 2019-03-08: 500 mL via INTRAVENOUS

## 2019-03-08 MED ORDER — LACTATED RINGERS IV SOLN: 500.0000 mL | Freq: Once | INTRAVENOUS | Status: DC

## 2019-03-08 MED ORDER — SODIUM CHLORIDE 0.9% FLUSH: 3.0000 mL | INTRAVENOUS | Status: DC | PRN

## 2019-03-08 MED ORDER — SODIUM CHLORIDE 0.9% FLUSH: 3.0000 mL | Freq: Two times a day (BID) | INTRAVENOUS | Status: DC

## 2019-03-08 MED ORDER — METHYLERGONOVINE MALEATE 0.2 MG/ML IJ SOLN: 0.2000 mg | INTRAMUSCULAR | Status: DC | PRN

## 2019-03-08 MED ORDER — ONDANSETRON HCL 4 MG/2ML IJ SOLN: 4.0000 mg | INTRAMUSCULAR | Status: DC | PRN

## 2019-03-08 MED ORDER — LIDOCAINE HCL (PF) 1 % IJ SOLN
INTRAMUSCULAR | Status: DC | PRN
  Administered 2019-03-08: 5 mL via EPIDURAL

## 2019-03-08 MED ORDER — METHYLERGONOVINE MALEATE 0.2 MG PO TABS: 0.2000 mg | ORAL_TABLET | ORAL | Status: DC | PRN

## 2019-03-08 MED ORDER — LIDOCAINE HCL (PF) 1 % IJ SOLN: 30.0000 mL | INTRAMUSCULAR | Status: DC | PRN

## 2019-03-08 MED ORDER — ACETAMINOPHEN 325 MG PO TABS
650.0000 mg | ORAL_TABLET | ORAL | Status: DC | PRN
  Administered 2019-03-08: 650 mg via ORAL
  Filled 2019-03-08: qty 2

## 2019-03-08 MED ORDER — LACTATED RINGERS IV SOLN
INTRAVENOUS | Status: DC
  Administered 2019-03-08: 09:00:00 via INTRAVENOUS
  Administered 2019-03-08: 125 mL/h via INTRAVENOUS

## 2019-03-08 MED ORDER — FENTANYL-BUPIVACAINE-NACL 0.5-0.125-0.9 MG/250ML-% EP SOLN
12.0000 mL/h | EPIDURAL | Status: DC | PRN
  Filled 2019-03-08: qty 250

## 2019-03-08 MED ORDER — TERBUTALINE SULFATE 1 MG/ML IJ SOLN: 0.2500 mg | Freq: Once | INTRAMUSCULAR | Status: DC | PRN

## 2019-03-08 MED ORDER — METHYLERGONOVINE MALEATE 0.2 MG/ML IJ SOLN
INTRAMUSCULAR | Status: AC
  Filled 2019-03-08: qty 1

## 2019-03-08 MED ORDER — PHENYLEPHRINE 40 MCG/ML (10ML) SYRINGE FOR IV PUSH (FOR BLOOD PRESSURE SUPPORT)
80.0000 ug | PREFILLED_SYRINGE | INTRAVENOUS | Status: DC | PRN
  Filled 2019-03-08: qty 10

## 2019-03-08 MED ORDER — LACTATED RINGERS IV SOLN
500.0000 mL | INTRAVENOUS | Status: DC | PRN
  Administered 2019-03-08: 500 mL via INTRAVENOUS

## 2019-03-08 MED ORDER — PHENYLEPHRINE 40 MCG/ML (10ML) SYRINGE FOR IV PUSH (FOR BLOOD PRESSURE SUPPORT): 80.0000 ug | PREFILLED_SYRINGE | INTRAVENOUS | Status: DC | PRN

## 2019-03-08 MED ORDER — SODIUM CHLORIDE 0.9 % IV SOLN: 250.0000 mL | INTRAVENOUS | Status: DC | PRN

## 2019-03-08 NOTE — Anesthesia Postprocedure Evaluation (Signed)
Anesthesia Post Note  Patient: Amie Critchley  Procedure(s) Performed: AN AD [place]     Patient location during evaluation: Mother Baby Anesthesia Type: Epidural Level of consciousness: awake and alert, oriented and patient cooperative Pain management: pain level controlled Vital Signs Assessment: post-procedure vital signs reviewed and stable Respiratory status: spontaneous breathing Cardiovascular status: stable Postop Assessment: no headache, epidural receding, patient able to bend at knees and no signs of nausea or vomiting Anesthetic complications: no Comments: Pt. States she is walking.  Pain score 6.  Pt. Encouraged to communicate pain needs to bedside RN.    Last Vitals:  Vitals:   03/08/19 1600 03/08/19 1730  BP: 140/89 113/70  Pulse: 60 63  Resp: 18 20  Temp: 36.8 C 36.8 C  SpO2:      Last Pain:  Vitals:   03/08/19 1756  TempSrc:   PainSc: 5    Pain Goal:                   [facility]

## 2019-03-08 NOTE — Anesthesia Preprocedure Evaluation (Signed)
Anesthesia Evaluation  Patient identified by MRN, date of birth, ID band Patient awake    Reviewed: Allergy & Precautions, NPO status , Patient's Chart, lab work & pertinent test results  Airway Mallampati: II  TM Distance: >3 FB Neck ROM: Full    Dental no notable dental hx. (+) Teeth Intact   Pulmonary asthma ,    Pulmonary exam normal breath sounds clear to auscultation       Cardiovascular Exercise Tolerance: Good negative cardio ROS Normal cardiovascular exam Rhythm:Regular Rate:Normal     Neuro/Psych negative neurological ROS  negative psych ROS   GI/Hepatic negative GI ROS, Neg liver ROS,   Endo/Other  negative endocrine ROS  Renal/GU negative Renal ROS     Musculoskeletal   Abdominal   Peds  Hematology Hgb 11.1 Plt 282   Anesthesia Other Findings   Reproductive/Obstetrics (+) Pregnancy                             Anesthesia Physical Anesthesia Plan  ASA: II  Anesthesia Plan: Epidural   Post-op Pain Management:    Induction:   PONV Risk Score and Plan:   Airway Management Planned:   Additional Equipment:   Intra-op Plan:   Post-operative Plan:   Informed Consent: I have reviewed the patients History and Physical, chart, labs and discussed the procedure including the risks, benefits and alternatives for the proposed anesthesia with the patient or authorized representative who has indicated his/her understanding and acceptance.       Plan Discussed with:   Anesthesia Plan Comments: (#9 6/7 wk G3P2 w Hx of asthma in past (well Controlled) for LEA)        Anesthesia Quick Evaluation

## 2019-03-08 NOTE — Anesthesia Procedure Notes (Signed)

## 2019-03-08 NOTE — MAU Note (Signed)
Kathleen Cooke is a 23 y.o. at [redacted]w[redacted]d here in MAU reporting:  +contractions Denies LOF or VB Endorses being 3 cm in the office last week Patient appears uncomfortable during triage. Swaying at bedside. Onset of complaint: 4-5am Pain score: 6/10. Desires epidural Vitals:   03/08/19 0828  BP: 114/85  Pulse: 74  Resp: 17  Temp: 98.1 F (36.7 C)  SpO2: 98%    FHT: 135 Lab orders placed from triage: mau labor triage

## 2019-03-08 NOTE — Progress Notes (Addendum)
CSW aware of consult for THC use during pregnancy. CSW did thorough chart review and sees no documentation stating THC was used during this pregnancy. CSW aware in various H&P's throughout pregnancy that drug use indicates marijuana but states "not currently" and has since 03/08/2018. CSW aware that [facility] records also state "none" for illicit drug use during pregnancy. At this time, CSW screening out referral and will not be following results as there is no indication for UDS or CDS to be collected. CSW has updated [facility] of the above.  Please consult CSW if current concerns arise or by MOB's request.  Lincy Belles, LCSWA  [facility] [phone]   

## 2019-03-08 NOTE — H&P (Signed)
23 y.o. [redacted]w[redacted]d  G3P2002 comes in c/o labor.  Otherwise has good fetal movement and no bleeding.  Past Medical History:  Diagnosis Date  . Asthma   . Headache     Past Surgical History:  Procedure Laterality Date  . NO PAST SURGERIES      OB History  Gravida Para Term Preterm AB Living  3 2 2  0 0 2  SAB TAB Ectopic Multiple Live Births  0 0 0 0 2    # Outcome Date GA Lbr Len/2nd Weight Sex Delivery Anes PTL Lv  3 Current           2 Term 03/08/18 [redacted]w[redacted]d 04:24 / 00:48 2934 g F Vag-Spont EPI  LIV     Birth Comments: small abrasion on inner left forearm   1 Term 08/22/15 [redacted]w[redacted]d 08:04 / 01:48 3400 g M Vag-Spont EPI  LIV    Social History   Socioeconomic History  . Marital status: Single    Spouse name: Not on file  . Number of children: Not on file  . Years of education: Not on file  . Highest education level: Not on file  Occupational History  . Not on file  Social Needs  . Financial resource strain: Not on file  . Food insecurity    Worry: Never true    Inability: Never true  . Transportation needs    Medical: No    Non-medical: No  Tobacco Use  . Smoking status: Never Smoker  . Smokeless tobacco: Never Used  Substance and Sexual Activity  . Alcohol use: No  . Drug use: Not Currently    Types: Marijuana    Comment: used cocaine 1.5 yrs ago   . Sexual activity: Not Currently    Birth control/protection: None  Lifestyle  . Physical activity    Days per week: Not on file    Minutes per session: Not on file  . Stress: Not on file  Relationships  . Social Herbalist on phone: Not on file    Gets together: Not on file    Attends religious service: Not on file    Active member of club or organization: Not on file    Attends meetings of clubs or organizations: Not on file    Relationship status: Not on file  . Intimate partner violence    Fear of current or ex partner: Not on file    Emotionally abused: Not on file    Physically abused: Not on file   Forced sexual activity: Not on file  Other Topics Concern  . Not on file  Social History Narrative  . Not on file   Patient has no known allergies.    Prenatal Transfer Tool  Maternal Diabetes: No Genetic Screening: Normal Maternal Ultrasounds/Referrals: Normal Fetal Ultrasounds or other Referrals:  None Maternal Substance Abuse:  No Significant Maternal Medications:  Meds include: Other: asthma meds Significant Maternal Lab Results: Group B Strep negative  Other PNC: uncomplicated.    Vitals:   03/08/19 0828  BP: 114/85  Pulse: 74  Resp: 17  Temp: 98.1 F (36.7 C)  TempSrc: Oral  SpO2: 98%    Lungs/Cor:  NAD Abdomen:  soft, gravid Ex:  no cords, erythema SVE:  6/60/-2 FHTs:  140, good STV, NST R; Cat 1 tracing. Toco:  q 5   A/P   Term labor.  GBS neg.  RI.  Kathleen Cooke

## 2019-03-09 LAB — CBC
HCT: 31.3 % — ABNORMAL LOW (ref 36.0–46.0)
Hemoglobin: 9.8 g/dL — ABNORMAL LOW (ref 12.0–15.0)
MCH: 26.1 pg (ref 26.0–34.0)
MCHC: 31.3 g/dL (ref 30.0–36.0)
MCV: 83.5 fL (ref 80.0–100.0)
Platelets: 248 10*3/uL (ref 150–400)
RBC: 3.75 MIL/uL — ABNORMAL LOW (ref 3.87–5.11)
RDW: 17.2 % — ABNORMAL HIGH (ref 11.5–15.5)
WBC: 10.5 10*3/uL (ref 4.0–10.5)
nRBC: 0 % (ref 0.0–0.2)

## 2019-03-09 MED ORDER — FERROUS SULFATE 325 (65 FE) MG PO TABS: 325.0000 mg | ORAL_TABLET | Freq: Every day | ORAL | 3 refills | Status: DC

## 2019-03-09 MED ORDER — IBUPROFEN 800 MG PO TABS: 800.0000 mg | ORAL_TABLET | Freq: Three times a day (TID) | ORAL | 0 refills | Status: DC

## 2019-03-09 NOTE — Lactation Note (Signed)
This note was copied from a baby's chart. Lactation Consultation Note  Patient Name: Kathleen Cooke [id number] Date: 03/09/2019 Reason for consult: Initial assessment;Term P3, 13 hour female infant. 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  Infant had one void and two stools. Mom is active on the [facility] program in [facility] and she has DEBP at home. Per mom, she breastfed her other two children for two months each. Mom feels breastfeeding is going well with  Infant and he is breastfeeding for  15 minutes for each feeding. Mom had infant latched on her right breast using the cradle hold, infant was breastfeeding as  LC entered the room, LC  did not assist with latch at this time.  Mom knows to breastfeed according to hunger cues, 8 to 12 times within 24 hours and on demand. Mom will do as much STS as possible. Mom knows to call RN or Nurse if she has any questions, concerns or need assistance with latching infant to breast.  Reviewed Baby & Me book's Breastfeeding Basics.  Mom made aware of O/P services, breastfeeding support groups, community resources, and our phone # for post-discharge questions.  Maternal Data Formula Feeding for Exclusion: No Has patient been taught Hand Expression?: Yes(Mom taught back hand expression.) Does the patient have breastfeeding experience prior to this delivery?: Yes  Feeding Feeding Type: Breast Fed  LATCH Score Latch: Grasps breast easily, tongue down, lips flanged, rhythmical sucking.  Audible Swallowing: Spontaneous and intermittent  Type of Nipple: Everted at rest and after stimulation  Comfort (Breast/Nipple): Soft / non-tender  Hold (Positioning): No assistance needed to correctly position infant at breast.  LATCH Score: 10  Interventions Interventions: Breast feeding basics reviewed;Skin to skin;Hand express  Lactation Tools Discussed/Used WIC Program: Yes   Consult  Status Consult Status: Follow-up Date: 03/09/19 Follow-up type: In-patient    Vicente Serene 03/09/2019, 2:24 AM

## 2019-03-09 NOTE — Discharge Summary (Signed)
Obstetric Discharge Summary Reason for Admission: onset of labor Prenatal Procedures: none Intrapartum Procedures: spontaneous vaginal delivery Postpartum Procedures: none Complications-Operative and Postpartum: none Hemoglobin  Date Value Ref Range Status  03/09/2019 9.8 (L) 12.0 - 15.0 g/dL Final  02/21/2018 9.4 (L) 11.1 - 15.9 g/dL Final   HCT  Date Value Ref Range Status  03/09/2019 31.3 (L) 36.0 - 46.0 % Final   Hematocrit  Date Value Ref Range Status  02/21/2018 29.6 (L) 34.0 - 46.6 % Final      Discharge Diagnoses: Term Pregnancy-delivered  Discharge Information: Date: 03/09/2019 Activity: pelvic rest Diet: routine Medications: Ibuprofen and Iron Condition: stable Instructions: refer to practice specific booklet Discharge to: home Follow-up Information    Bobbye Charleston, MD Follow up in 4 week(s).   Specialty: Obstetrics and Gynecology Contact information: [place] [address] [phone]           Newborn Data: Live born female  Birth Weight: 8 lb 9.6 oz (3900 g) APGAR: 24, 9  Newborn Delivery   Birth date/time: 03/08/2019 13:14:00 Delivery type: Vaginal, Spontaneous      Home with mother.  Circ by peds.   Daria Pastures 03/09/2019, 9:52 AM

## 2019-03-09 NOTE — Progress Notes (Signed)
Patient is eating, ambulating, voiding.  Pain control is good.  Vitals:   03/08/19 1730 03/08/19 2158 03/09/19 0145 03/09/19 0530  BP: 113/70 117/84 108/73 104/61  Pulse: 63 62 67 66  Resp: 20 18 17 16   Temp: 98.2 F (36.8 C) 98.4 F (36.9 C) 98.6 F (37 C) 98.2 F (36.8 C)  TempSrc:  Oral Oral Oral  SpO2:      Weight:      Height:        Fundus firm Perineum without swelling.  Lab Results  Component Value Date   WBC 10.5 03/09/2019   HGB 9.8 (L) 03/09/2019   HCT 31.3 (L) 03/09/2019   MCV 83.5 03/09/2019   PLT 248 03/09/2019    --/--/O POS, O POS Performed at [facility], [place] [address] [place], [place] [address]  (12/05 0842)/RI  A/P Post partum day 1.  Routine care.  Expect d/c today.  Iron. Daria Pastures

## 2019-03-09 NOTE — Lactation Note (Signed)
This note was copied from a baby's chart. Lactation Consultation Note  Patient Name: Kathleen Cooke [id number] Date: 03/09/2019 Reason for consult: Follow-up assessment;Term;Infant weight loss  28 hours old FT female who is being exclusively BF by his mother, she's a P3 and BF her other two children for about 2 months. RN Suzie called for [facility] assistance because mom requested to go home today. Baby is at 4% weight loss and he has been feeding consistently per mom but she has forgotten to write down some of those feedings. NP Lauren working with mom and baby when entering the room and she requested a latch assist prior doing baby's discharge.  LC took baby STS to mother's right breast and mom did typical cradle hold, and complained of some soreness. LC showed mom how to break the latch and advised to try cross cradle instead, she did and the second time latch was much deeper baby's lips were flanged and he was sucking rhythmically with a few audible swallows noted, praised mom for her efforts. Baby still nursing when exiting the room at the 10 minutes mark.  Reviewed discharge instructions, engorgement prevention/treatment, treatment/prevention for sore nipples and red flags on when to call baby's pediatrician. Mom reported all questions and concerns were answered, she's aware of [place] OP services and will call PRN.  Maternal Data    Feeding Feeding Type: Breast Fed  LATCH Score Latch: Grasps breast easily, tongue down, lips flanged, rhythmical sucking.  Audible Swallowing: A few with stimulation  Type of Nipple: Everted at rest and after stimulation  Comfort (Breast/Nipple): Soft / non-tender  Hold (Positioning): Assistance needed to correctly position infant at breast and maintain latch.  LATCH Score: 8  Interventions Interventions: Breast feeding basics reviewed;Assisted with latch;Skin to skin;Breast massage;Hand express;Breast compression;Adjust position;Support pillows  Lactation  Tools Discussed/Used     Consult Status Consult Status: Complete Date: 03/09/19 Follow-up type: In-patient    Kathleen Cooke 03/09/2019, 5:28 PM

## 2019-03-11 ENCOUNTER — Inpatient Hospital Stay (HOSPITAL_COMMUNITY)
Admission: AD | Admit: 2019-03-11 | Payer: Medicaid Other | Source: Home / Self Care | Admitting: Obstetrics and Gynecology

## 2019-03-11 ENCOUNTER — Inpatient Hospital Stay (HOSPITAL_COMMUNITY): Payer: Medicaid Other

## 2019-07-07 ENCOUNTER — Other Ambulatory Visit: Payer: Self-pay

## 2019-07-07 ENCOUNTER — Ambulatory Visit (HOSPITAL_COMMUNITY)
Admission: EM | Admit: 2019-07-07 | Discharge: 2019-07-07 | Disposition: A | Discharge status: Home / Self Care | Payer: Medicaid Other | Attending: Family Medicine | Admitting: Family Medicine

## 2019-07-07 ENCOUNTER — Encounter (HOSPITAL_COMMUNITY): Payer: Self-pay

## 2019-07-07 DIAGNOSIS — B001 Herpesviral vesicular dermatitis: Secondary | ICD-10-CM | POA: Diagnosis not present

## 2019-07-07 DIAGNOSIS — Z3202 Encounter for pregnancy test, result negative: Secondary | ICD-10-CM

## 2019-07-07 LAB — POCT URINALYSIS DIP (DEVICE)
Bilirubin Urine: NEGATIVE
Glucose, UA: NEGATIVE mg/dL
Ketones, ur: NEGATIVE mg/dL
Nitrite: NEGATIVE
Protein, ur: 30 mg/dL — AB
Specific Gravity, Urine: >=1.03 (ref 1.005–1.030)
Urobilinogen, UA: 0.2 mg/dL (ref 0.0–1.0)
pH: 5.5 (ref 5.0–8.0)

## 2019-07-07 LAB — POCT PREGNANCY, URINE: Preg Test, Ur: NEGATIVE

## 2019-07-07 LAB — POC URINE PREG, ED: Preg Test, Ur: NEGATIVE

## 2019-07-07 MED ORDER — POLYETHYLENE GLYCOL 3350 17 G PO PACK: 17.0000 g | PACK | Freq: Every day | ORAL | 0 refills | Status: DC

## 2019-07-07 MED ORDER — VALACYCLOVIR HCL 1 G PO TABS: 1000.0000 mg | ORAL_TABLET | Freq: Three times a day (TID) | ORAL | 1 refills | Status: DC

## 2019-07-07 MED ORDER — HYDROCODONE-ACETAMINOPHEN 5-325 MG PO TABS: 1.0000 | ORAL_TABLET | ORAL | 0 refills | Status: DC | PRN

## 2019-07-07 NOTE — ED Triage Notes (Signed)
Pt c/o hemrrhoidsx4 days. Pt states she's also having dysuria started yesterday and pt states she noticed a bump in her vaginal area started yesterday. Pt denies N/V, lower back pain.

## 2019-07-07 NOTE — ED Provider Notes (Signed)
[facility]    CSN: [id number] Arrival date & time: 07/07/19  3818      History   Chief Complaint Chief Complaint  Patient presents with  . hemrrhoids    HPI Kathleen Cooke is a 24 y.o. female.   HPI  Patient thinks she might have hemorrhoids.  She has bumps on the outside of her vagina and rectal area.  Its painful move her bowels.  Is painful to urinate.  She is also having some irregular vaginal bleeding.  She had a baby 3 months ago.  She was placed on oral contraceptives.  She has been having irregular bleeding ever since that time.  No abdominal pain.  No fever or chills.  No history of STD in the past.  She had a postpartum visit and Pap (question pelvic exam) that was negative a few weeks ago. She has been with the same partner for 2 years.  She does not feel like she was exposed to STDs. She has never had an STD, never had herpes  Past Medical History:  Diagnosis Date  . Asthma   . Headache     Patient Active Problem List   Diagnosis Date Noted  . Indication for care in labor or delivery 03/08/2019  . 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 Cholestasis during pregnancy 02/25/2018  . Elevated transaminase level 02/21/2018  . Asthma 01/16/2018  . Abnormal genetic test 11/06/2017  . Abnormal MSAFP (maternal serum alpha-fetoprotein), elevated 11/06/2017  . Anemia 11/06/2017  . Supervision of other normal pregnancy, antepartum 10/25/2017    Past Surgical History:  Procedure Laterality Date  . NO PAST SURGERIES      OB History    Gravida  3   Para  3   Term  3   Preterm  0   AB  0   Living  3     SAB  0   TAB  0   Ectopic  0   Multiple  0   Live Births  3            Home Medications    Prior to Admission medications   Medication Sig Start Date End Date Taking? Authorizing Provider  HYDROcodone-acetaminophen (NORCO/VICODIN) 5-325 MG tablet Take 1-2 tablets by mouth every 4 (four) hours as needed for  severe pain. 07/07/19   Raylene Everts, MD  polyethylene glycol (MIRALAX MIX-IN PAX) 17 g packet Take 17 g by mouth daily. 07/07/19   Raylene Everts, MD  valACYclovir (VALTREX) 1000 MG tablet Take 1 tablet (1,000 mg total) by mouth 3 (three) times daily. 07/07/19   Raylene Everts, MD  ferrous sulfate 325 (65 FE) MG tablet Take 1 tablet (325 mg total) by mouth daily with breakfast. 03/09/19 07/07/19  Bobbye Charleston, MD    Family History Family History  Problem Relation Age of Onset  . Birth defects Neg Hx   . Diabetes Neg Hx   . Early death Neg Hx   . Heart disease Neg Hx   . Hypertension Neg Hx   . Stroke Neg Hx   . Miscarriages / Stillbirths Neg Hx     Social History Social History   Tobacco Use  . Smoking status: Never Smoker  . Smokeless tobacco: Never Used  Substance Use Topics  . Alcohol use: No  . Drug use: Not Currently    Types: Marijuana    Comment: used cocaine 1.5 yrs ago      Allergies  Patient has no known allergies.   Review of Systems Review of Systems  Gastrointestinal: Positive for rectal pain.  Genitourinary: Positive for difficulty urinating, dysuria, genital sores, menstrual problem and vaginal pain.     Physical Exam Triage Vital Signs ED Triage Vitals  Enc Vitals Group     BP 07/07/19 1005 114/77     Pulse Rate 07/07/19 1005 76     Resp 07/07/19 1005 18     Temp 07/07/19 1005 98.1 F (36.7 C)     Temp Source 07/07/19 1005 Oral     SpO2 07/07/19 1005 97 %     Weight 07/07/19 1006 125 lb (56.7 kg)     Height 07/07/19 1006 5\' 2"  (1.575 m)     Head Circumference --      Peak Flow --      Pain Score 07/07/19 1006 10     Pain Loc --      Pain Edu? --      Excl. in [place]? --    No data found.  Updated Vital Signs BP 114/77   Pulse 76   Temp 98.1 F (36.7 C) (Oral)   Resp 18   Ht 5\' 2"  (1.575 m)   Wt 56.7 kg   SpO2 97%   BMI 22.86 kg/m      Physical Exam Constitutional:      General: She is not in acute distress.     Appearance: She is well-developed.  HENT:     Head: Normocephalic and atraumatic.     Mouth/Throat:     Comments: Mask is in place Eyes:     Conjunctiva/sclera: Conjunctivae normal.     Pupils: Pupils are equal, round, and reactive to light.  Cardiovascular:     Rate and Rhythm: Normal rate.  Pulmonary:     Effort: Pulmonary effort is normal. No respiratory distress.  Abdominal:     General: There is no distension.     Palpations: Abdomen is soft.     Tenderness: There is no abdominal tenderness.  Genitourinary:    Comments: External genitalia has scattered ulcerated lesions from the urethra to below the rectum Musculoskeletal:        General: Normal range of motion.     Cervical back: Normal range of motion.  Skin:    General: Skin is warm and dry.  Neurological:     General: No focal deficit present.     Mental Status: She is alert.  Psychiatric:        Mood and Affect: Mood normal.        Behavior: Behavior normal.      UC Treatments / Results  Labs (all labs ordered are listed, but only abnormal results are displayed) Labs Reviewed  POCT URINALYSIS DIP (DEVICE) - Abnormal; Notable for the following components:      Result Value   Hgb urine dipstick LARGE (*)    Protein, ur 30 (*)    Leukocytes,Ua SMALL (*)    All other components within normal limits  HSV CULTURE AND TYPING  POC URINE PREG, ED  POCT PREGNANCY, URINE  CERVICOVAGINAL ANCILLARY ONLY    EKG   Radiology No results found.  Procedures Procedures (including critical care time)  Medications Ordered in UC Medications - No data to display  Initial Impression / Assessment and Plan / UC Course  I have reviewed the triage vital signs and the nursing notes.  Pertinent labs & imaging results that were available during my care of  the patient were reviewed by me and considered in my medical decision making (see chart for details).     Swab for STD.  Swab for HSV.  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  Discussed treatment. Final Clinical Impressions(s) / UC Diagnoses   Final diagnoses:  Herpes labialis     Discharge Instructions     Take the valtrex 3 x a day Take the pain medicine as needed Take the miralax daily This will help with soft stool that is easier to pass Follow up with your PCP Your results will be available on My Chart   ED Prescriptions    Medication Sig Dispense Auth. Provider   valACYclovir (VALTREX) 1000 MG tablet Take 1 tablet (1,000 mg total) by mouth 3 (three) times daily. 21 tablet Eustace Moore, MD   HYDROcodone-acetaminophen (NORCO/VICODIN) 5-325 MG tablet Take 1-2 tablets by mouth every 4 (four) hours as needed for severe pain. 20 tablet Eustace Moore, MD   polyethylene glycol (MIRALAX MIX-IN PAX) 17 g packet Take 17 g by mouth daily. 14 each Eustace Moore, MD     I have reviewed the PDMP during this encounter.   Eustace Moore, MD 07/07/19 1201

## 2019-07-07 NOTE — Discharge Instructions (Signed)
Take the valtrex 3 x a day Take the pain medicine as needed Take the miralax daily This will help with soft stool that is easier to pass Follow up with your PCP Your results will be available on My Chart

## 2019-07-08 LAB — CERVICOVAGINAL ANCILLARY ONLY
Chlamydia: NEGATIVE
Comment: NEGATIVE
Comment: NEGATIVE
Comment: NORMAL
Neisseria Gonorrhea: NEGATIVE
Trichomonas: NEGATIVE

## 2019-07-09 LAB — HSV CULTURE AND TYPING

## 2019-08-22 ENCOUNTER — Encounter (HOSPITAL_COMMUNITY): Payer: Self-pay

## 2019-08-22 ENCOUNTER — Other Ambulatory Visit: Payer: Self-pay

## 2019-08-22 ENCOUNTER — Ambulatory Visit (HOSPITAL_COMMUNITY)
Admission: EM | Admit: 2019-08-22 | Discharge: 2019-08-22 | Disposition: A | Discharge status: Home / Self Care | Payer: Medicaid Other | Attending: Emergency Medicine | Admitting: Emergency Medicine

## 2019-08-22 DIAGNOSIS — Z1152 Encounter for screening for COVID-19: Secondary | ICD-10-CM | POA: Insufficient documentation

## 2019-08-22 DIAGNOSIS — J069 Acute upper respiratory infection, unspecified: Secondary | ICD-10-CM

## 2019-08-22 MED ORDER — CETIRIZINE HCL 10 MG PO TABS: 10.0000 mg | ORAL_TABLET | Freq: Every day | ORAL | 0 refills | Status: DC

## 2019-08-22 MED ORDER — FLUTICASONE PROPIONATE 50 MCG/ACT NA SUSP
1.0000 | Freq: Every day | NASAL | 0 refills | Status: DC
Start: 2019-08-22 — End: 2020-07-29

## 2019-08-22 MED ORDER — BENZONATATE 100 MG PO CAPS
100.0000 mg | ORAL_CAPSULE | Freq: Three times a day (TID) | ORAL | 0 refills | Status: DC
Start: 2019-08-22 — End: 2020-07-29

## 2019-08-22 NOTE — ED Provider Notes (Signed)
[facility]    CSN: [id number] Arrival date & time: 08/22/19  0813      History   Chief Complaint Chief Complaint  Patient presents with  . Cough    HPI Kathleen Cooke is a 24 y.o. female.   Presented to the urgent care for complaint of cough, congestion, runny nose, sore throat, headache and loss of taste discharge yesterday.  Denies sick exposure to COVID, flu or strep.  Denies recent travel.  Denies aggravating or alleviating symptoms.  Denies previous COVID infection.   Denies fever, chills, fatigue,  SOB, wheezing, chest pain, nausea, vomiting, changes in bowel or bladder habits.    The history is provided by the patient. No language interpreter was used.    Past Medical History:  Diagnosis Date  . Asthma   . Headache     Patient Active Problem List   Diagnosis Date Noted  . Indication for care in labor or delivery 03/08/2019  . 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 Cholestasis during pregnancy 02/25/2018  . Elevated transaminase level 02/21/2018  . Asthma 01/16/2018  . Abnormal genetic test 11/06/2017  . Abnormal MSAFP (maternal serum alpha-fetoprotein), elevated 11/06/2017  . Anemia 11/06/2017  . Supervision of other normal pregnancy, antepartum 10/25/2017    Past Surgical History:  Procedure Laterality Date  . NO PAST SURGERIES      OB History    Gravida  3   Para  3   Term  3   Preterm  0   AB  0   Living  3     SAB  0   TAB  0   Ectopic  0   Multiple  0   Live Births  3            Home Medications    Prior to Admission medications   Medication Sig Start Date End Date Taking? Authorizing Provider  benzonatate (TESSALON) 100 MG capsule Take 1 capsule (100 mg total) by mouth every 8 (eight) hours. 08/22/19   Taffie Eckmann, Zachery Dakins, FNP  cetirizine (ZYRTEC ALLERGY) 10 MG tablet Take 1 tablet (10 mg total) by mouth daily. 08/22/19   Lakiyah Arntson, Zachery Dakins, FNP  fluticasone (FLONASE) 50 MCG/ACT nasal  spray Place 1 spray into both nostrils daily for 14 days. 08/22/19 09/05/19  Clarity Ciszek, Zachery Dakins, FNP  HYDROcodone-acetaminophen (NORCO/VICODIN) 5-325 MG tablet Take 1-2 tablets by mouth every 4 (four) hours as needed for severe pain. 07/07/19   Eustace Moore, MD  polyethylene glycol (MIRALAX MIX-IN PAX) 17 g packet Take 17 g by mouth daily. 07/07/19   Eustace Moore, MD  valACYclovir (VALTREX) 1000 MG tablet Take 1 tablet (1,000 mg total) by mouth 3 (three) times daily. 07/07/19   Eustace Moore, MD  ferrous sulfate 325 (65 FE) MG tablet Take 1 tablet (325 mg total) by mouth daily with breakfast. 03/09/19 07/07/19  Carrington Clamp, MD    Family History Family History  Problem Relation Age of Onset  . Birth defects Neg Hx   . Diabetes Neg Hx   . Early death Neg Hx   . Heart disease Neg Hx   . Hypertension Neg Hx   . Stroke Neg Hx   . Miscarriages / Stillbirths Neg Hx     Social History Social History   Tobacco Use  . Smoking status: Never Smoker  . Smokeless tobacco: Never Used  Substance Use Topics  . Alcohol use: No  . Drug use: Not Currently  Types: Marijuana    Comment: used cocaine 1.5 yrs ago      Allergies   Patient has no known allergies.   Review of Systems Review of Systems  Constitutional: Negative.   HENT: Positive for congestion, rhinorrhea and sore throat.   Respiratory: Positive for cough.   Cardiovascular: Negative.   Gastrointestinal: Negative.   Neurological: Positive for headaches.       Loss of taste  All other systems reviewed and are negative.    Physical Exam Triage Vital Signs ED Triage Vitals  Enc Vitals Group     BP      Pulse      Resp      Temp      Temp src      SpO2      Weight      Height      Head Circumference      Peak Flow      Pain Score      Pain Loc      Pain Edu?      Excl. in [place]?    No data found.  Updated Vital Signs BP 111/66 (BP Location: Left Arm)   Pulse 82   Temp 97.7 F (36.5 C) (Oral)    Resp 18   LMP 08/19/2019   SpO2 98%   Visual Acuity Right Eye Distance:   Left Eye Distance:   Bilateral Distance:    Right Eye Near:   Left Eye Near:    Bilateral Near:     Physical Exam Vitals and nursing note reviewed.  Constitutional:      General: She is not in acute distress.    Appearance: Normal appearance. She is normal weight. She is not ill-appearing or toxic-appearing.  HENT:     Head: Normocephalic.     Right Ear: Tympanic membrane, ear canal and external ear normal. There is no impacted cerumen.     Left Ear: Tympanic membrane, ear canal and external ear normal. There is no impacted cerumen.     Nose: Nose normal. No congestion.     Mouth/Throat:     Mouth: Mucous membranes are moist.     Pharynx: Oropharynx is clear. No oropharyngeal exudate or posterior oropharyngeal erythema.  Cardiovascular:     Rate and Rhythm: Normal rate and regular rhythm.     Pulses: Normal pulses.     Heart sounds: Normal heart sounds. No murmur.  Pulmonary:     Effort: Pulmonary effort is normal. No respiratory distress.     Breath sounds: Normal breath sounds. No wheezing or rhonchi.  Chest:     Chest wall: No tenderness.  Abdominal:     General: Abdomen is flat. Bowel sounds are normal. There is no distension.     Palpations: There is no mass.     Tenderness: There is no abdominal tenderness.  Skin:    Capillary Refill: Capillary refill takes less than 2 seconds.  Neurological:     General: No focal deficit present.     Mental Status: She is alert and oriented to person, place, and time.      UC Treatments / Results  Labs (all labs ordered are listed, but only abnormal results are displayed) Labs Reviewed  SARS CORONAVIRUS 2 (TAT 6-24 HRS)    EKG   Radiology No results found.  Procedures Procedures (including critical care time)  Medications Ordered in UC Medications - No data to display  Initial Impression / Assessment and Plan /  UC Course  I have  reviewed the triage vital signs and the nursing notes.  Pertinent labs & imaging results that were available during my care of the patient were reviewed by me and considered in my medical decision making (see chart for details).   Patient is stable at discharge.  Flonase, Zyrtec, Tessalon Perles were prescribed for symptomatic relief.  She was advised to quarantine until COVID-19 test result become available.  Final Clinical Impressions(s) / UC Diagnoses   Final diagnoses:  Viral URI with cough  Encounter for screening for COVID-19     Discharge Instructions     COVID testing ordered.  It will take between 2-7 days for test results.  Someone will contact you regarding abnormal results.    In the meantime: You should remain isolated in your home for 10 days from symptom onset AND greater than 24 hours after symptoms resolution (absence of fever without the use of fever-reducing medication and improvement in respiratory symptoms), whichever is longer Get plenty of rest and push fluids Tessalon Perles prescribed for cough Zyrtec-D prescribed for nasal congestion, runny nose, and/or sore throat Flonase prescribed for nasal congestion and runny nose Use medications daily for symptom relief Use OTC medications like ibuprofen or tylenol as needed fever or pain Call or go to the ED if you have any new or worsening symptoms such as fever, worsening cough, shortness of breath, chest tightness, chest pain, turning blue, changes in mental status, etc...     ED Prescriptions    Medication Sig Dispense Auth. Provider   cetirizine (ZYRTEC ALLERGY) 10 MG tablet Take 1 tablet (10 mg total) by mouth daily. 30 tablet Thereasa Iannello S, FNP   fluticasone (FLONASE) 50 MCG/ACT nasal spray Place 1 spray into both nostrils daily for 14 days. 16 g Clinton Dragone S, FNP   benzonatate (TESSALON) 100 MG capsule Take 1 capsule (100 mg total) by mouth every 8 (eight) hours. 30 capsule Jais Demir, Zachery Dakins, FNP       PDMP not reviewed this encounter.   Durward Parcel, FNP 08/22/19 [phone]

## 2019-08-22 NOTE — ED Triage Notes (Signed)
Pt c/o cough, congestion, runny nose, HA since Monday. Reports sore throat and loss of taste/smell yesterday.   Denies fever, n/v/d, abdom pain or SOB.  No tylenol/ibuprofen recently.

## 2019-08-22 NOTE — Discharge Instructions (Addendum)
COVID testing ordered.  It will take between 2-7 days for test results.  Someone will contact you regarding abnormal results.    In the meantime: You should remain isolated in your home for 10 days from symptom onset AND greater than 24 hours after symptoms resolution (absence of fever without the use of fever-reducing medication and improvement in respiratory symptoms), whichever is longer Get plenty of rest and push fluids Tessalon Perles prescribed for cough Zyrtec-D prescribed for nasal congestion, runny nose, and/or sore throat Flonase prescribed for nasal congestion and runny nose Use medications daily for symptom relief Use OTC medications like ibuprofen or tylenol as needed fever or pain Call or go to the ED if you have any new or worsening symptoms such as fever, worsening cough, shortness of breath, chest tightness, chest pain, turning blue, changes in mental status, etc...  

## 2019-08-23 LAB — SARS CORONAVIRUS 2 (TAT 6-24 HRS): SARS Coronavirus 2: NEGATIVE

## 2019-11-21 ENCOUNTER — Emergency Department (HOSPITAL_COMMUNITY)
Admission: EM | Admit: 2019-11-21 | Discharge: 2019-11-21 | Disposition: A | Discharge status: Home / Self Care | Payer: Medicaid Other

## 2019-12-21 IMAGING — US US MFM OB FOLLOW-UP
1 series · 14 of 28 positions shown · non-contrast
Comparison: none

[Series 1: us mfm ob follow-up · 45 acquisitions, 14 frames shown]
[im 2/45]
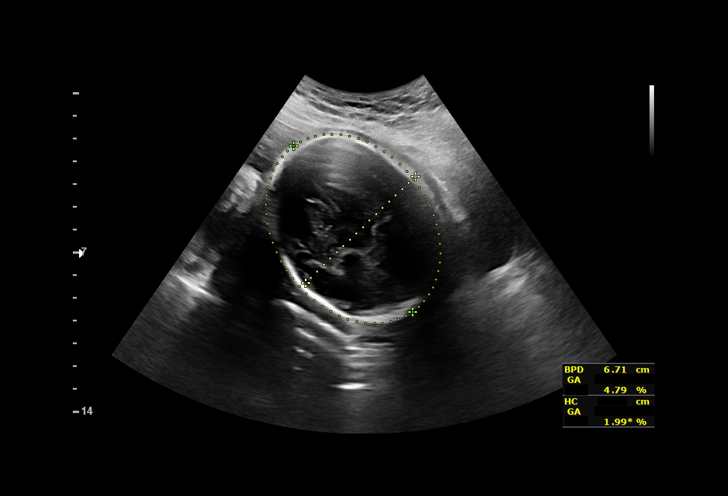
[im 5/45]
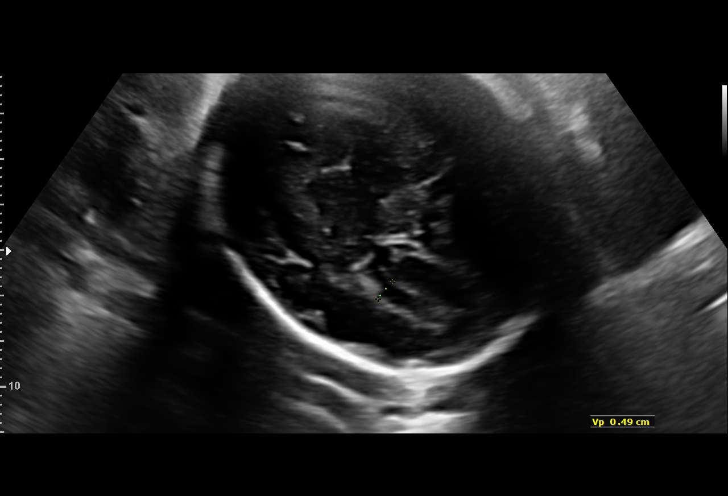
[im 9/45]
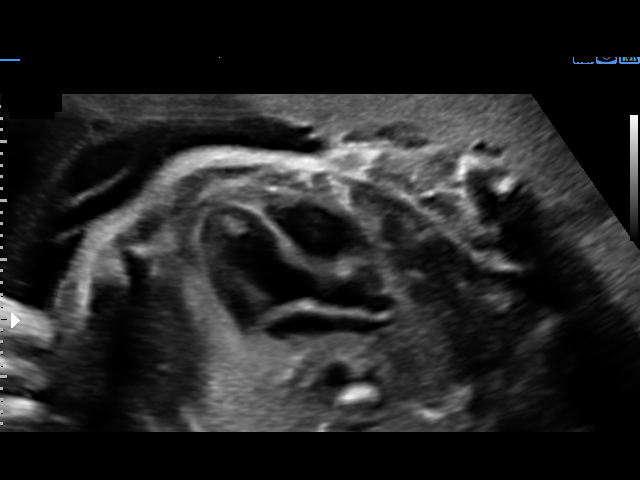
[im 12/45]
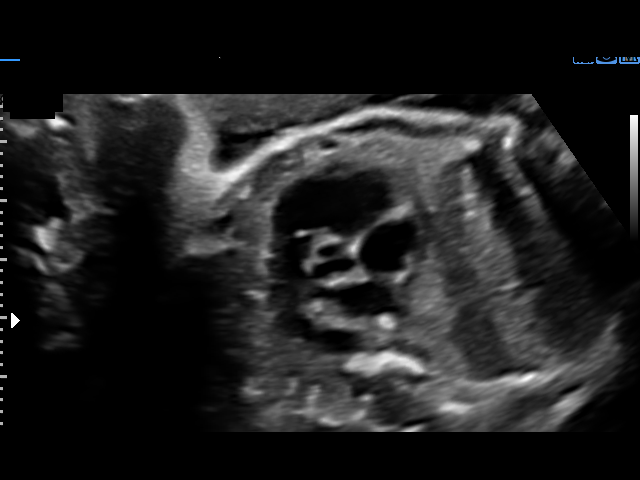
[im 15/45]
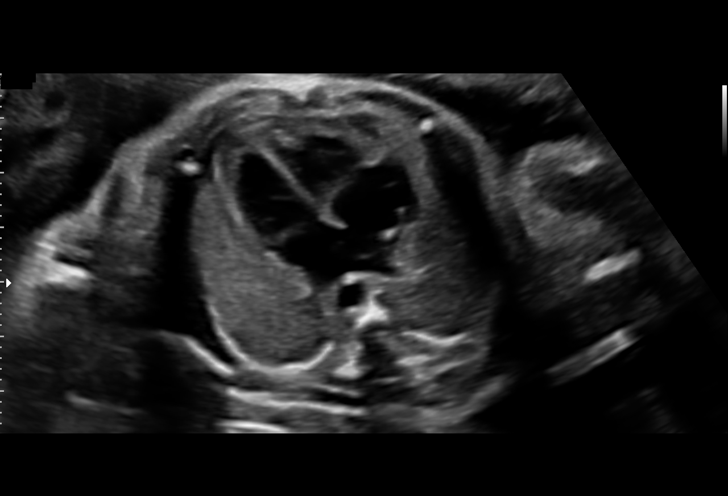
[im 18/45]
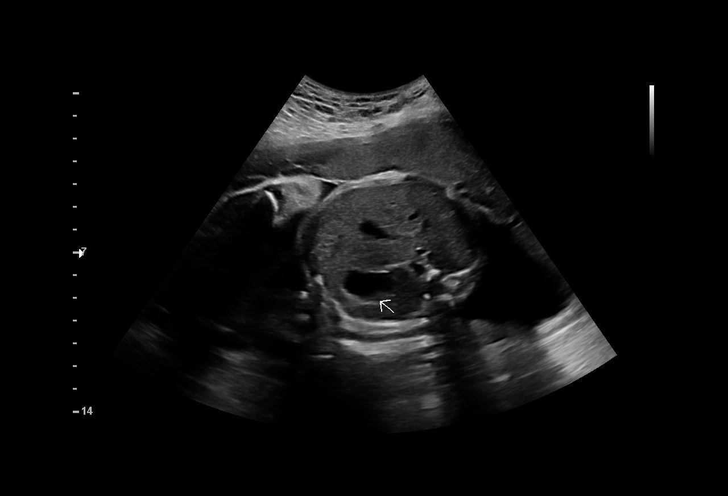
[im 22/45]
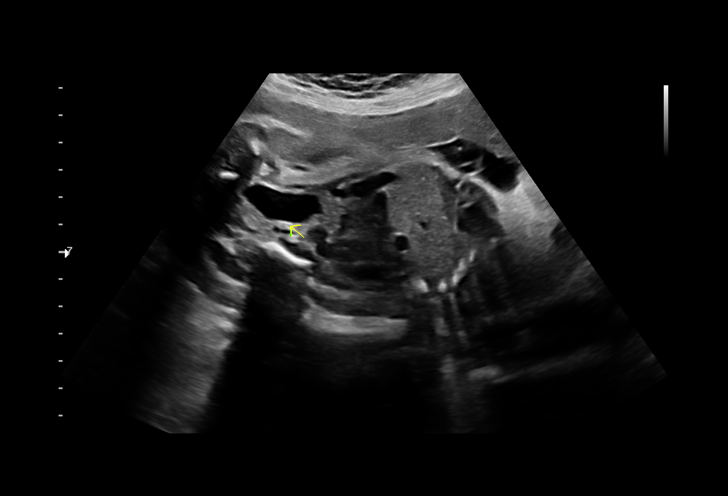
[im 25/45]
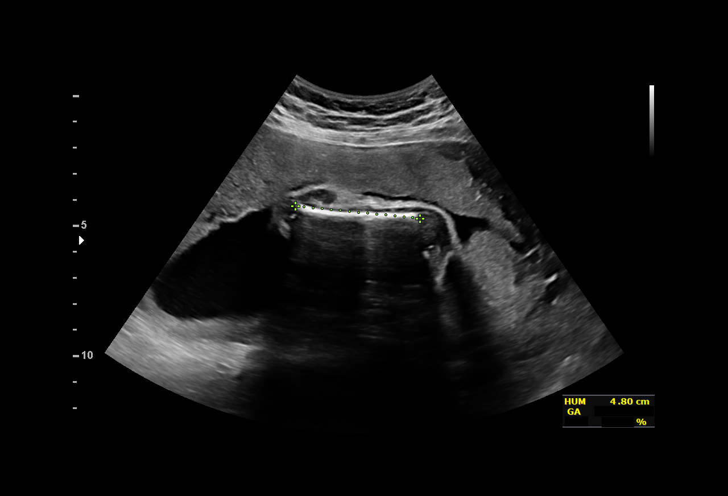
[im 28/45]
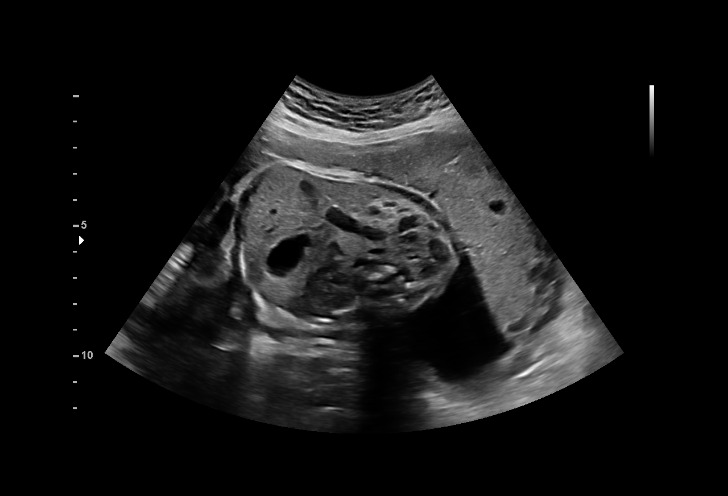
[im 31/45]
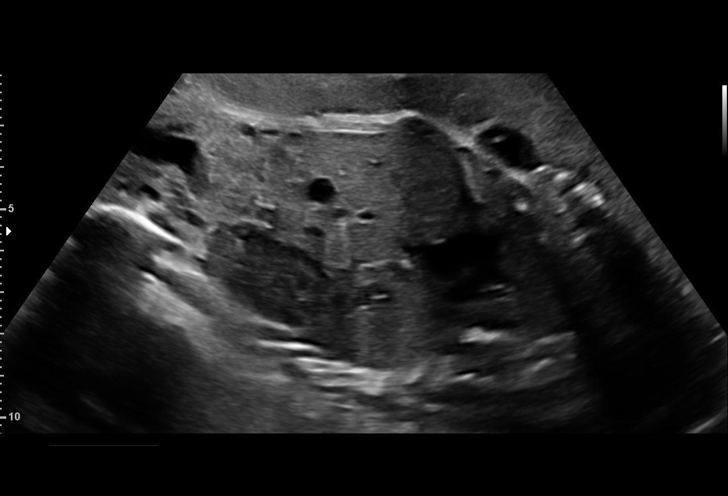
[im 35/45]
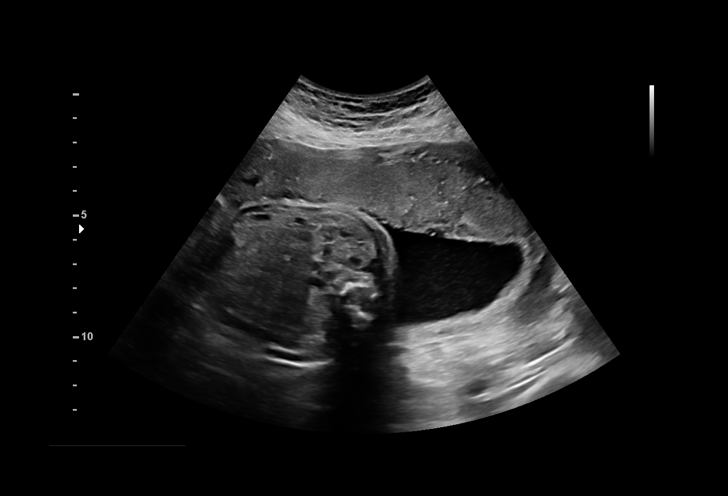
[im 38/45]
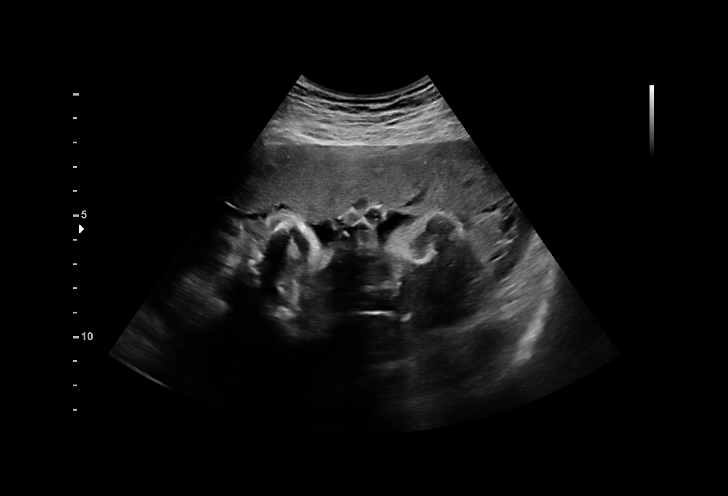
[im 41/45]
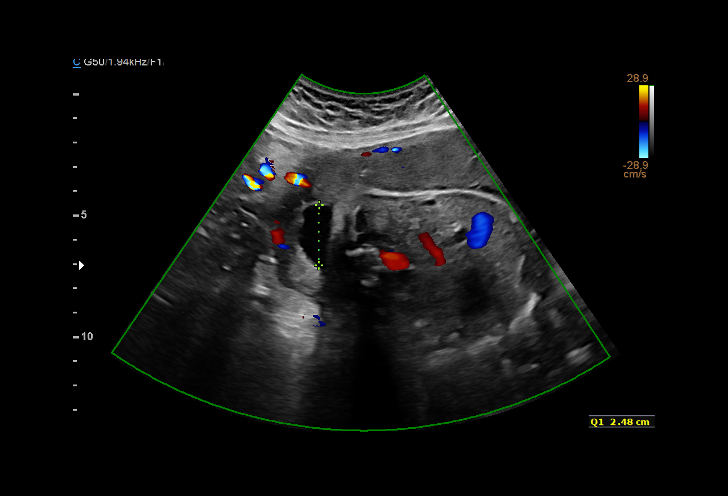
[im 45/45]
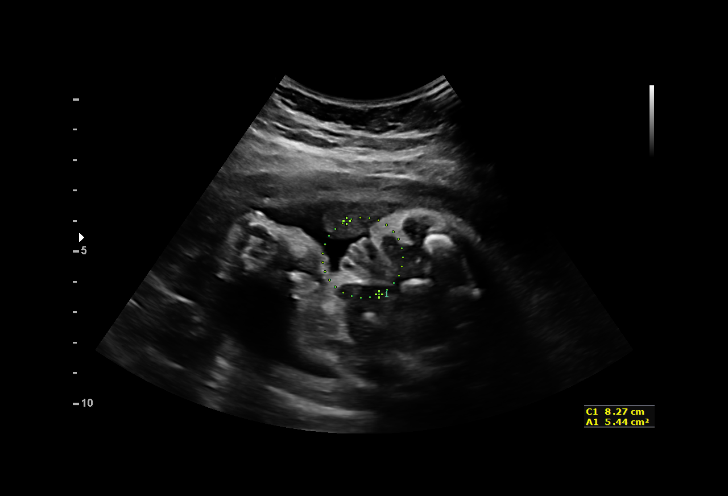

[14 of 28 positions shown; findings below may reference images not displayed]

OB/Gyn Clinic

Indications

Encounter for other antenatal screening
follow-up (EFW)
28 weeks gestation of pregnancy
Abnormal biochemical screen (AFP MoM
2.73)
Vital Signs

BMI:         25.05        Pulse:  81
BP:          105/63
Fetal Evaluation

Num Of Fetuses:         1
Fetal Heart Rate(bpm):  141
Cardiac Activity:       Observed
Presentation:           Cephalic
Placenta:               Anterior
P. Cord Insertion:      Previously Visualized

Amniotic Fluid
AFI FV:      Within normal limits

AFI Sum(cm)     %Tile       Largest Pocket(cm)
11.05           20

RUQ(cm)       RLQ(cm)       LUQ(cm)        LLQ(cm)
2.48
Biometry

BPD:      67.5  mm     G. Age:  27w 1d          6  %    CI:        74.67   %    70 - 86
FL/HC:      21.5   %    19.6 -
HC:      247.9  mm     G. Age:  26w 6d        < 3  %    HC/AC:      1.02        0.99 -
AC:      243.8  mm     G. Age:  28w 5d         45  %    FL/BPD:     79.0   %    71 - 87
FL:       53.3  mm     G. Age:  28w 2d         27  %    FL/AC:      21.9   %    20 - 24
HUM:      47.9  mm     G. Age:  28w 0d         36  %
LV:        4.9  mm

Est. FW:    8872  gm    2 lb 10 oz      44  %
Gestational Age

LMP:           28w 4d        Date:  06/22/17                 EDD:   03/29/18
U/S Today:     27w 5d                                        EDD:   04/04/18
Best:          28w 4d     Det. By:  LMP  (06/22/17)          EDD:   03/29/18
Anatomy

Cranium:               Appears normal         Aortic Arch:            Previously seen
Cavum:                 Previously seen        Ductal Arch:            Previously seen
Ventricles:            Appears normal         Diaphragm:              Previously seen
Choroid Plexus:        Previously seen        Stomach:                Appears normal, left
sided
Cerebellum:            Previously seen        Abdomen:                Appears normal
Posterior Fossa:       Previously seen        Abdominal Wall:         Previously seen
Nuchal Fold:           Previously seen        Cord Vessels:           Previously seen
Face:                  Orbits and profile     Kidneys:                Appear normal
previously seen
Lips:                  Previously seen        Bladder:                Appears normal
Thoracic:              Appears normal         Spine:                  Previously seen
Heart:                 Appears normal         Upper Extremities:      Previously seen
(4CH, axis, and
situs)
RVOT:                  Appears normal         Lower Extremities:      Previously seen
LVOT:                  Appears normal

Other:  Heels and 5th digit previously visualized. Nasal bone and Open
hands previously visualized.  Fetus appears to be female.
Cervix Uterus Adnexa

Cervix
Not visualized (advanced GA >09wks)
Comments

U/S images reviewed. Findings reviewed with patient.
Appropriate fetal growth is noted.  No fetal abnormalities are
seen.
Questions answered.
10 minutes spent face to face with patient.
Recommendations: 1) Serial U/S every 4 weeks for fetal
growth 2) Weekly BPP beginning @ 32 weeks secondary to
unexplained elevated MS-AFP
Recommendations

1) Serial U/S every 4 weeks for fetal growth 2) Weekly BPP
beginning @ 32 weeks secondary to unexplained elevated
MS-AFP

## 2020-01-27 IMAGING — US US FETAL BPP W/ NON-STRESS
1 series · 13 of 15 positions shown · non-contrast
Comparison: none

[Series 1: us fetal bpp w/nonstress · 15 acquisitions, 13 frames shown]
[im 1/15]
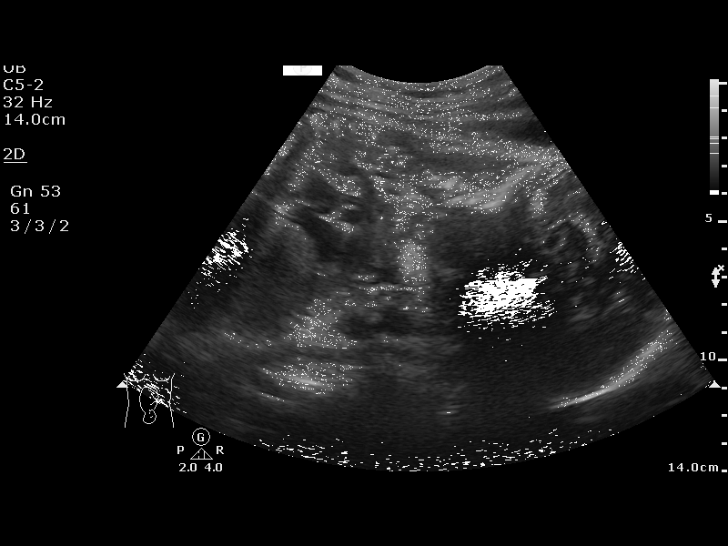
[im 2/15]
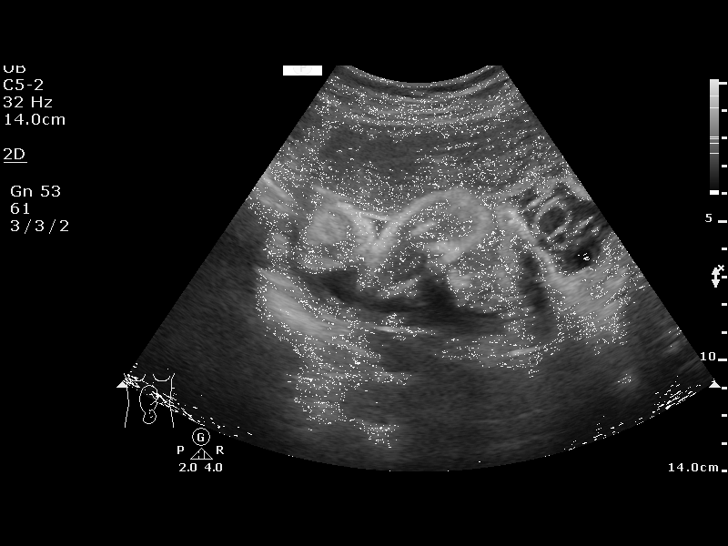
[im 3/15]
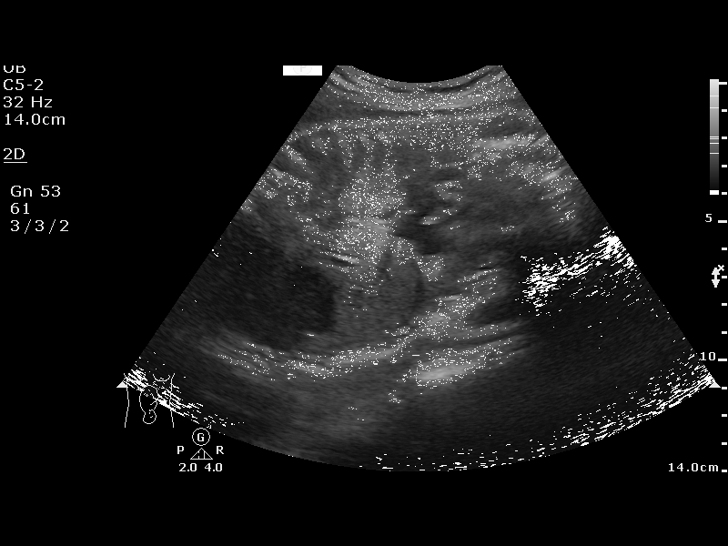
[im 5/15]
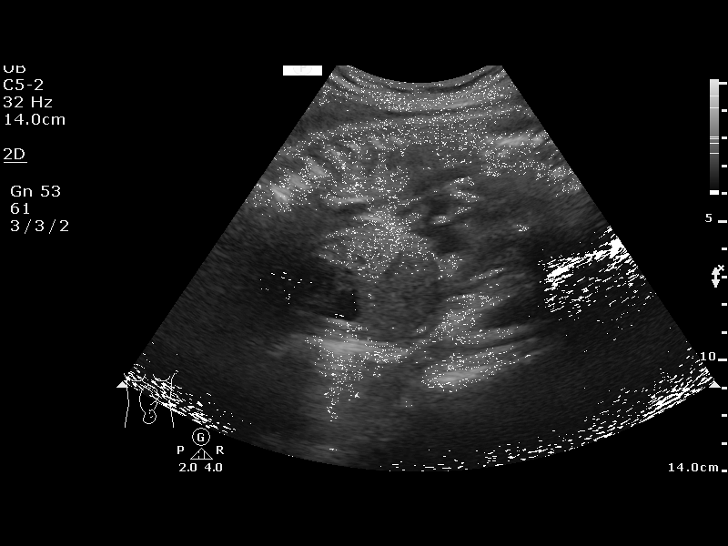
[im 6/15]
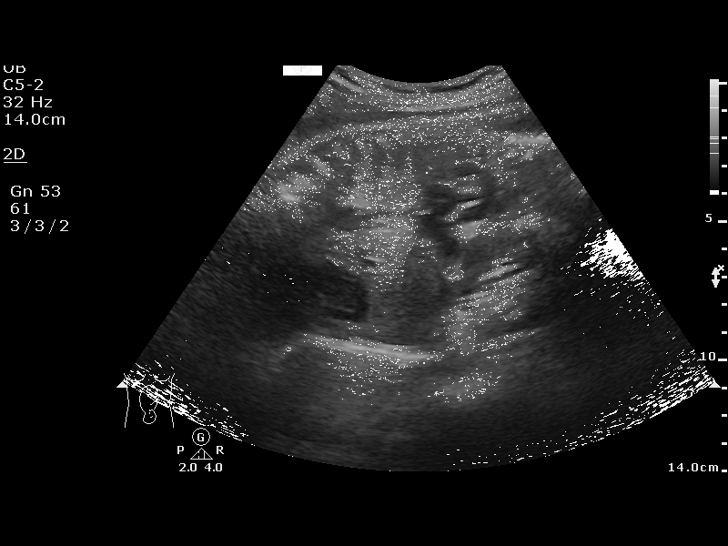
[im 7/15]
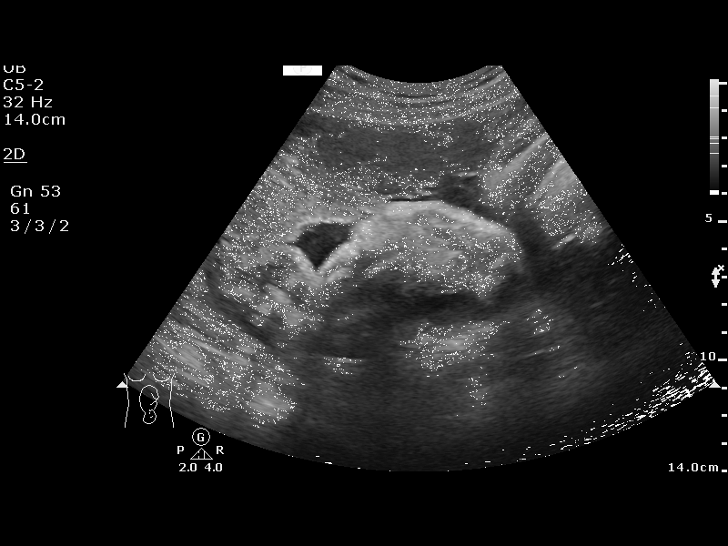
[im 8/15]
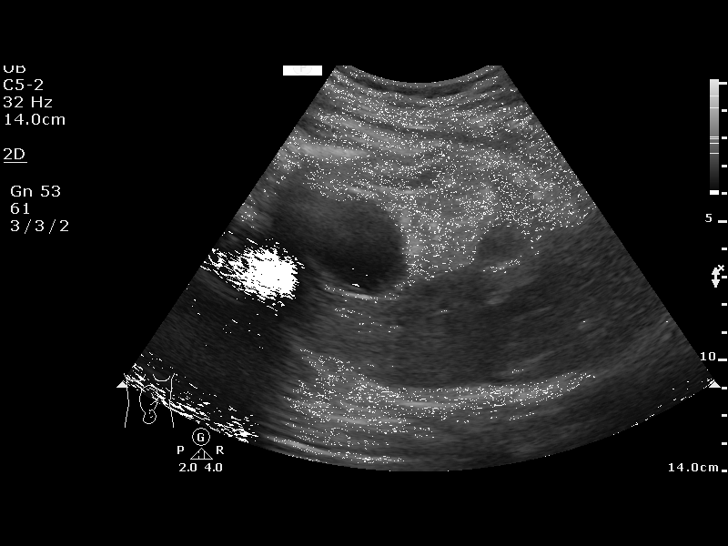
[im 9/15]
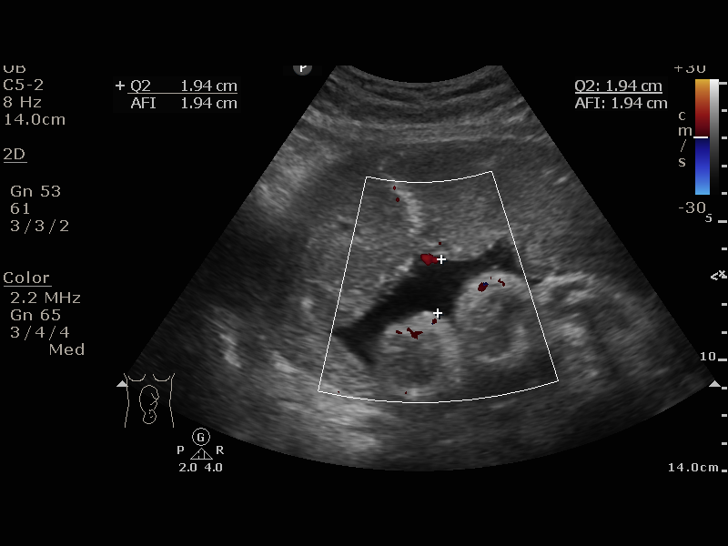
[im 10/15]
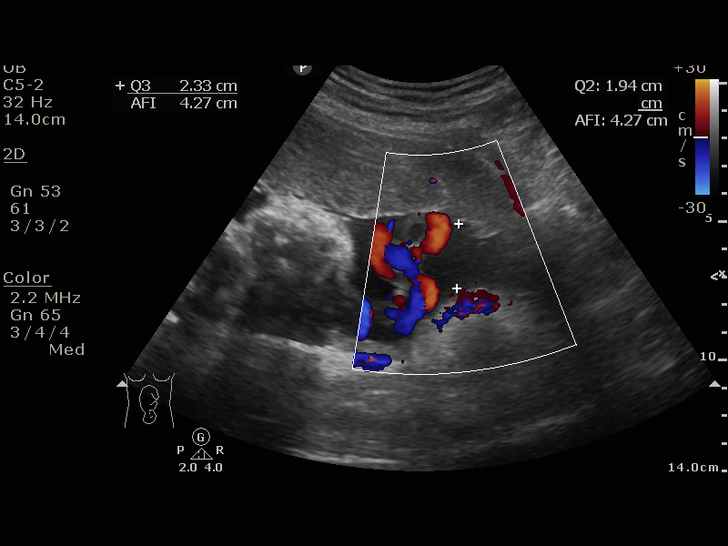
[im 11/15]
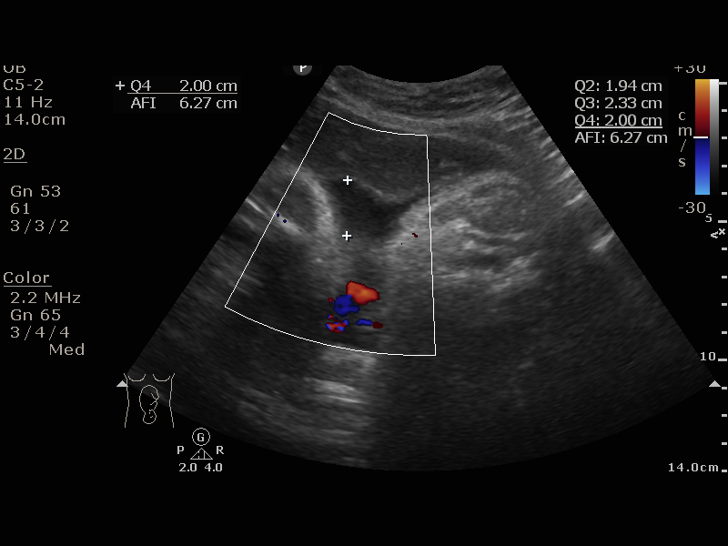
[im 13/15]
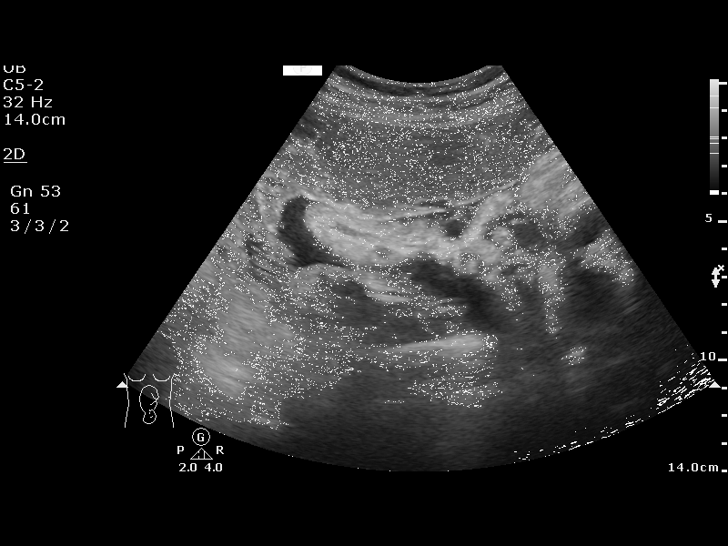
[im 14/15]
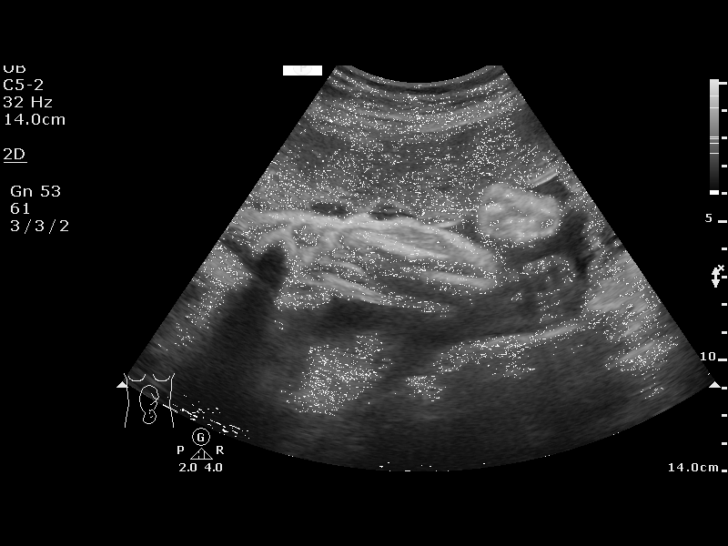
[im 15/15]
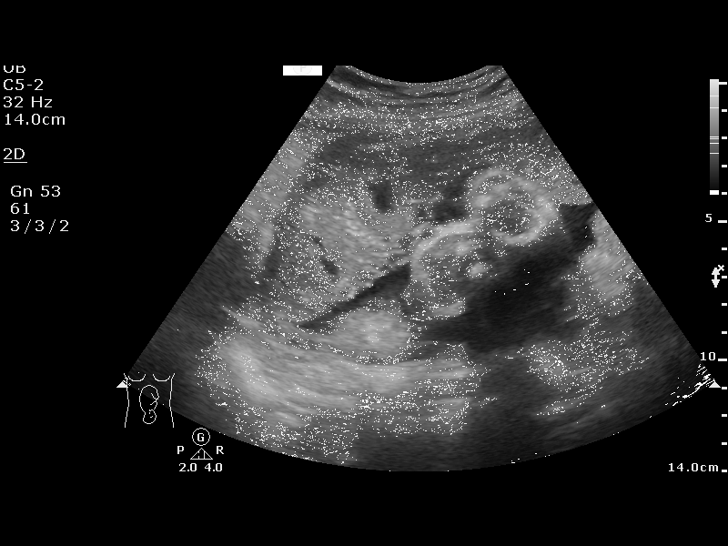

[13 of 15 positions shown; findings below may reference images not displayed]

OB/Gyn Clinic
                                                            Women's
                                                            [REDACTED]

  1  US FETAL BPP W/NONSTRESS             76818.4      ISAHI FZ
 ----------------------------------------------------------------------

 ----------------------------------------------------------------------
Service(s) Provided

 ----------------------------------------------------------------------
Indications

  33 weeks gestation of pregnancy
  Abnormal biochemical finding on antenatal
  screening of mother
 ----------------------------------------------------------------------
Vital Signs

                                                Height:        5'2"
Fetal Evaluation

 Num Of Fetuses:         1
 Preg. Location:         Intrauterine
 Cardiac Activity:       Observed
 Presentation:           Cephalic

 Amniotic Fluid
 AFI FV:      Subjectively decreased

 AFI Sum(cm)     %Tile       Largest Pocket(cm)
 6.27            < 3

 RUQ(cm)       RLQ(cm)       LUQ(cm)        LLQ(cm)
 0             2
Biophysical Evaluation

 Amniotic F.V:   Pocket => 2 cm two         F. Tone:        Observed
                 planes
 F. Movement:    Observed                   N.S.T:          Reactive
 F. Breathing:   Observed                   Score:          [DATE]
Gestational Age

 LMP:           33w 6d        Date:  06/22/17                 EDD:   03/29/18
 Best:          33w 6d     Det. By:  LMP  (06/22/17)          EDD:   03/29/18
Impression

 BPP [DATE], low normal fluid, 2 cm pocket
Recommendations

 Continue antepartum testing
                  Toto, Gunjaman

## 2020-02-03 IMAGING — US US MFM FETAL BPP W/O NON-STRESS
1 series · 14 of 28 positions shown · non-contrast
Comparison: none

[Series 1: us mfm fetal bpp w/o non-stress · 48 acquisitions, 14 frames shown]
[im 2/48]
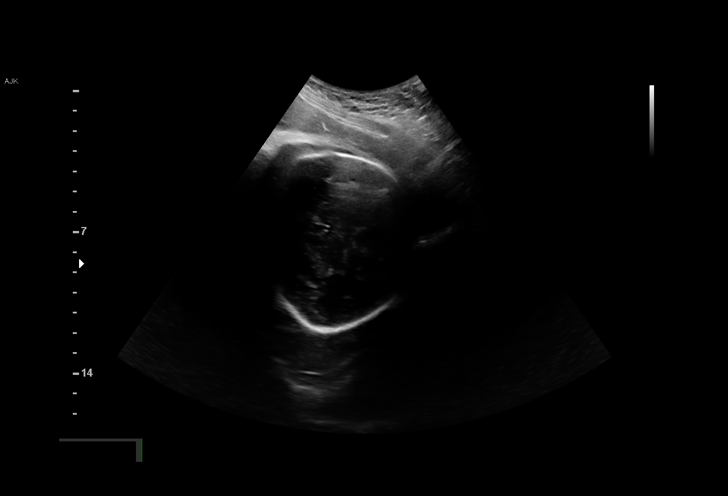
[im 6/48]
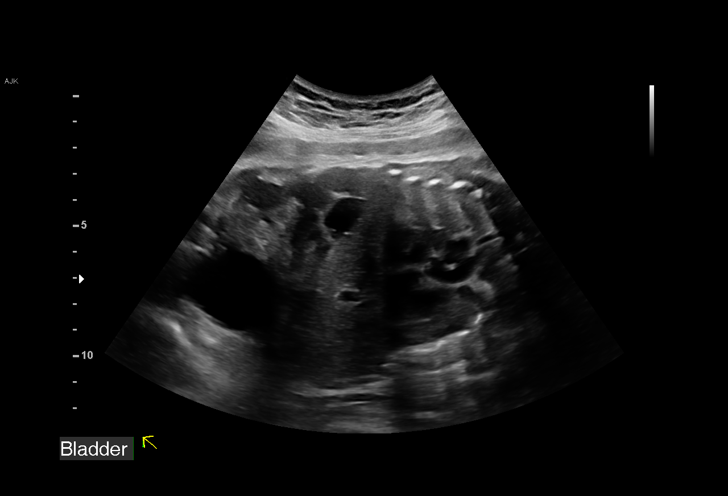
[im 9/48]
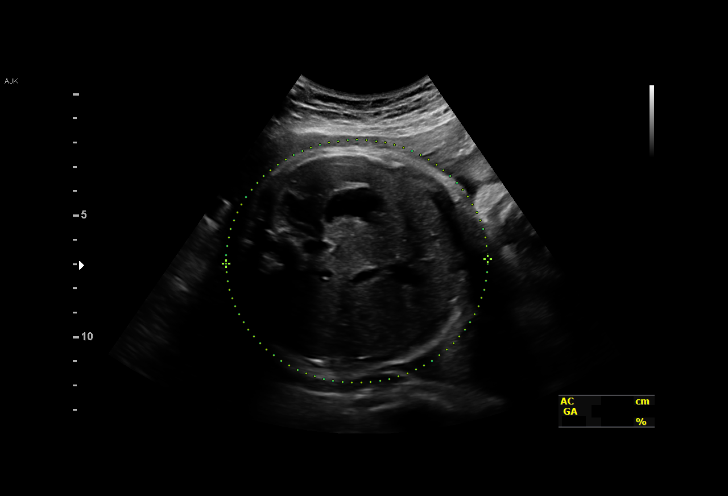
[im 13/48]
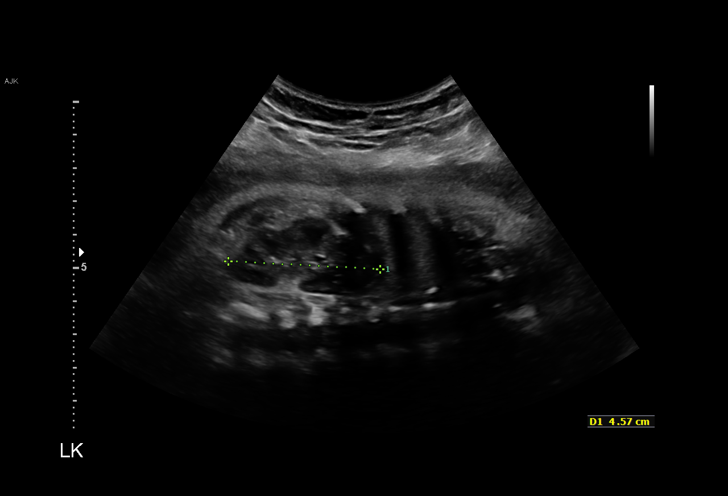
[im 16/48]
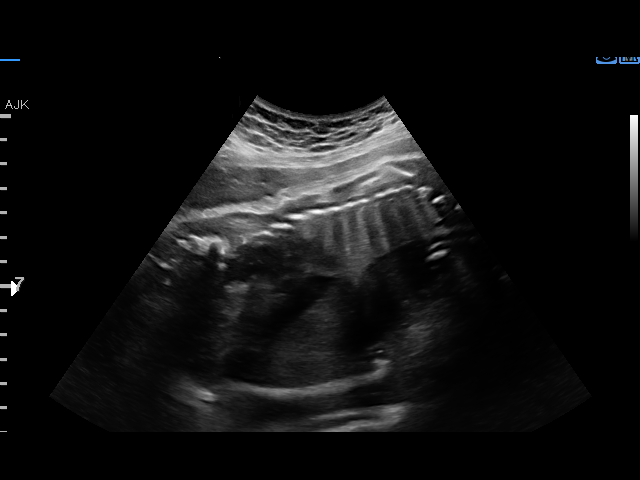
[im 20/48]
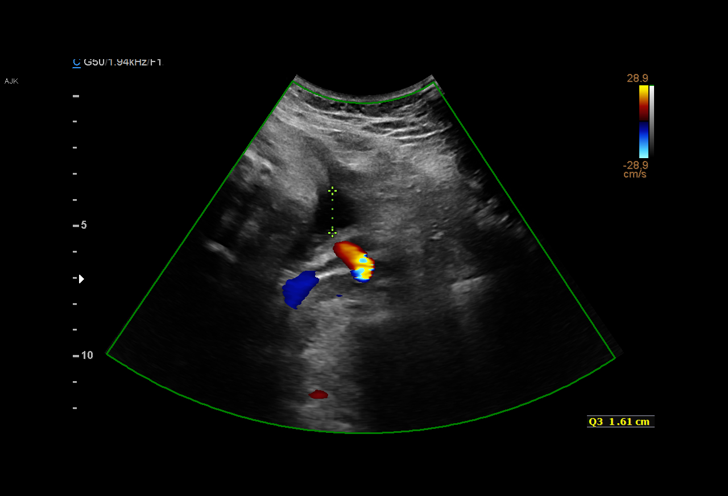
[im 23/48]
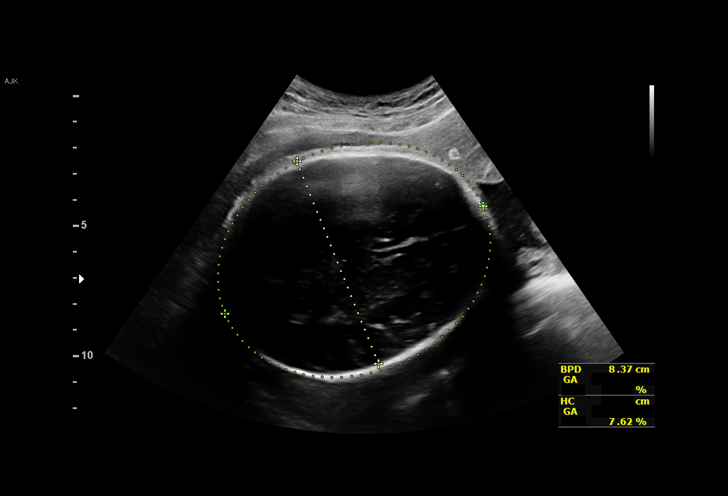
[im 27/48]
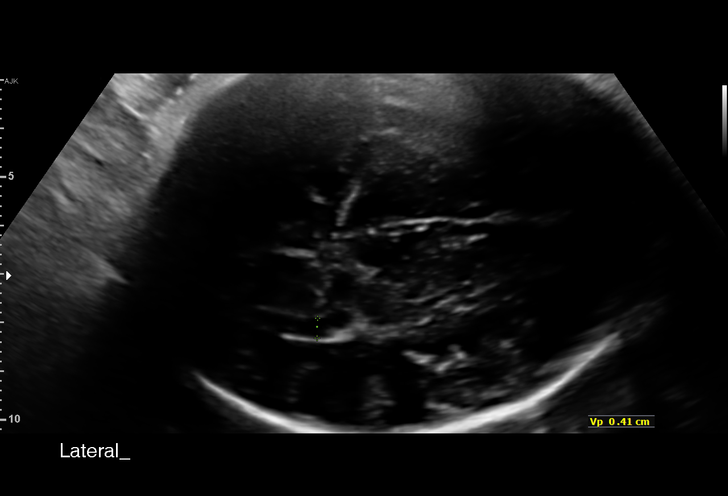
[im 30/48]
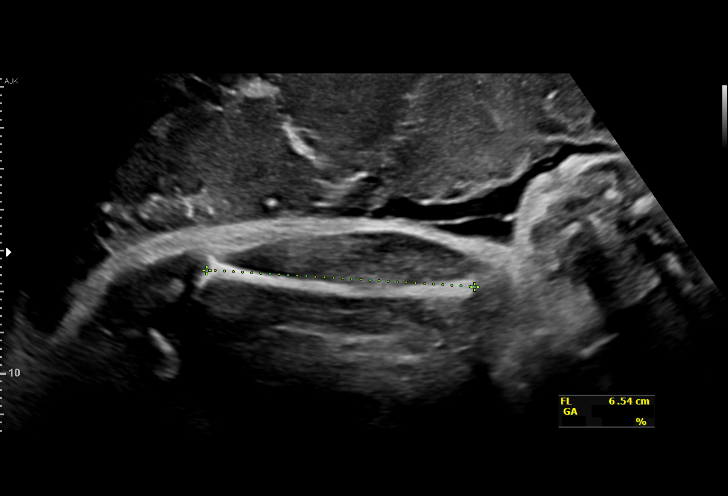
[im 34/48]
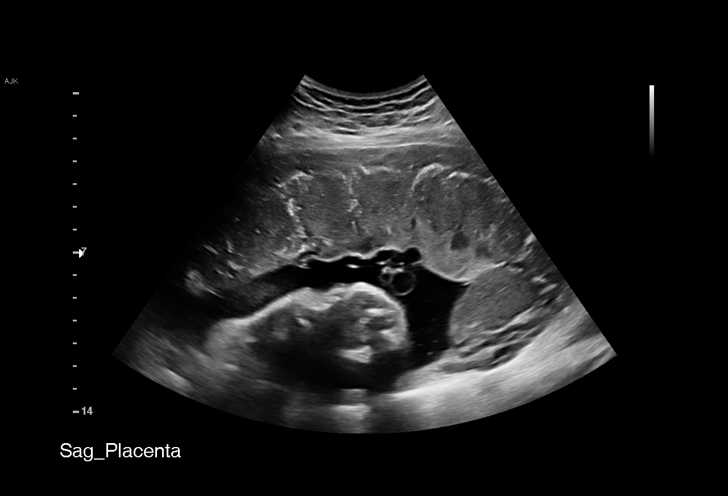
[im 37/48]
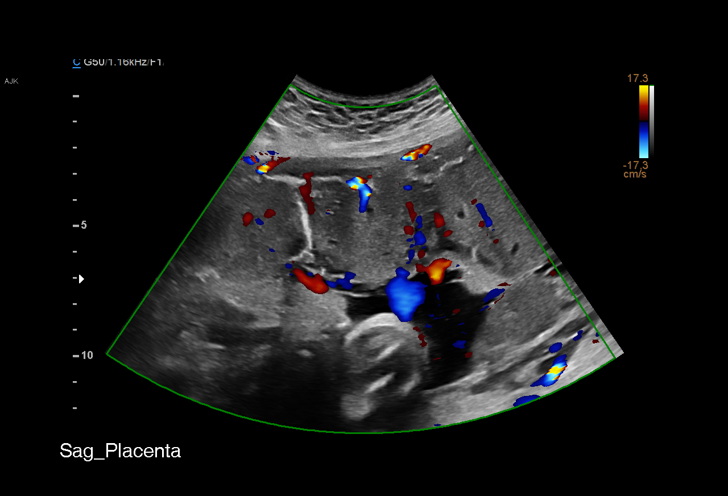
[im 41/48]
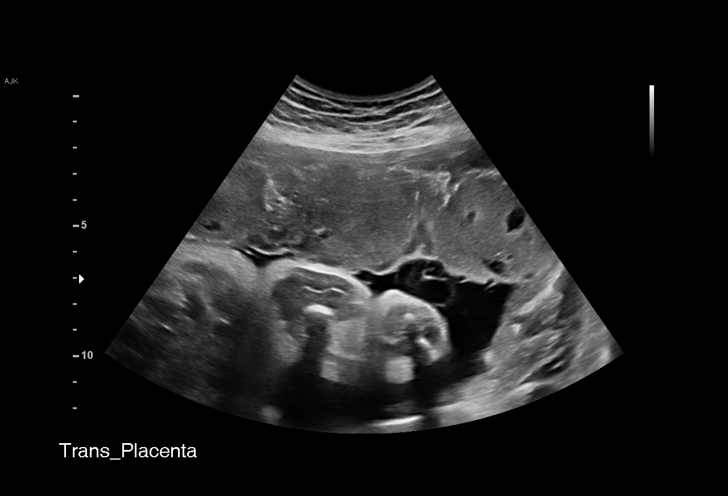
[im 44/48]
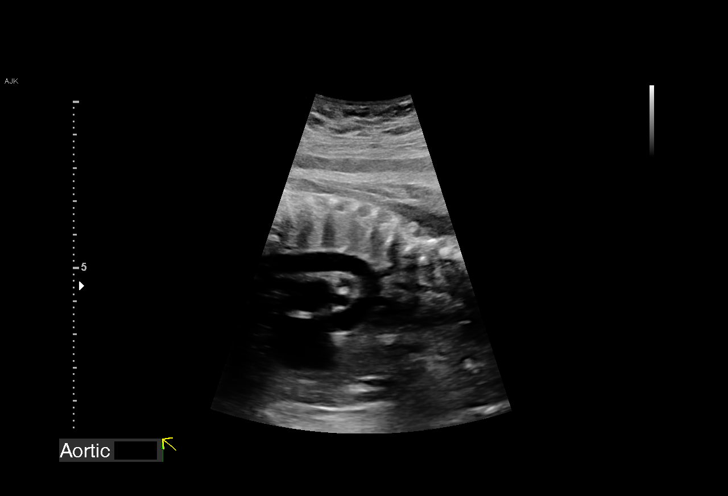
[im 48/48]
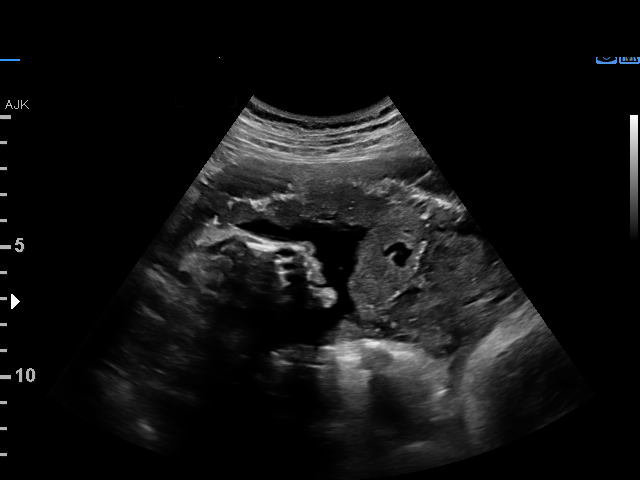

[14 of 28 positions shown; findings below may reference images not displayed]

OB/Gyn Clinic

 ----------------------------------------------------------------------

 ----------------------------------------------------------------------
Indications

  Encounter for other antenatal screening
  follow-up (EFW)
  Abnormal biochemical screen (AFP MoM
  2.73)
  34 weeks gestation of pregnancy
 ----------------------------------------------------------------------
Vital Signs

                                                Height:        5'2"
Fetal Evaluation

 Num Of Fetuses:         1
 Fetal Heart Rate(bpm):  146
 Cardiac Activity:       Observed
 Presentation:           Cephalic
 Placenta:               Anterior
 P. Cord Insertion:      Visualized

 Amniotic Fluid
 AFI FV:      Within normal limits

 AFI Sum(cm)     %Tile       Largest Pocket(cm)
 13.05           43
 RUQ(cm)       RLQ(cm)       LUQ(cm)        LLQ(cm)

Biophysical Evaluation

 Amniotic F.V:   Within normal limits       F. Tone:        Observed
 F. Movement:    Observed                   Score:          [DATE]
 F. Breathing:   Observed
Biometry

 BPD:      84.4  mm     G. Age:  34w 0d         27  %    CI:        73.66   %    70 - 86
                                                         FL/HC:      21.1   %    20.1 -
 HC:      312.4  mm     G. Age:  35w 0d         20  %    HC/AC:      0.96        0.93 -
 AC:      323.8  mm     G. Age:  36w 2d         89  %    FL/BPD:     78.0   %    71 - 87
 FL:       65.8  mm     G. Age:  33w 6d         20  %    FL/AC:      20.3   %    20 - 24

 LV:        4.1  mm

 Est. FW:    9715  gm    5 lb 13 oz      70  %
Gestational Age

 LMP:           34w 6d        Date:  06/22/17                 EDD:   03/29/18
 U/S Today:     34w 6d                                        EDD:   03/29/18
 Best:          34w 6d     Det. By:  LMP  (06/22/17)          EDD:   03/29/18
Anatomy

 Cranium:               Appears normal         Aortic Arch:            Appears normal
 Cavum:                 Appears normal         Ductal Arch:            Previously seen
 Ventricles:            Appears normal         Diaphragm:              Appears normal
 Choroid Plexus:        Previously seen        Stomach:                Appears normal, left
                                                                       sided
 Cerebellum:            Previously seen        Abdomen:                Appears normal
 Posterior Fossa:       Previously seen        Abdominal Wall:         Previously seen
 Nuchal Fold:           Previously seen        Cord Vessels:           Previously seen
 Face:                  Orbits and profile     Kidneys:                Appear normal
                        previously seen
 Lips:                  Previously seen        Bladder:                Appears normal
 Thoracic:              Appears normal         Spine:                  Previously seen
 Heart:                 Previously seen        Upper Extremities:      Previously seen
 RVOT:                  Previously seen        Lower Extremities:      Previously seen
 LVOT:                  Appears normal

 Other:  Heels and 5th digit previously visualized. Nasal bone and Open
         hands previously visualized.  Fetus appears to be female.
Impression

 Amniotic fluid is normal and good fetal activity is seen.
 Antenatal testing is reassuring. BPP [DATE]. Fetal growth is
 appropriate for gestational age.
Recommendations

 Follow-up as clinically indicated.
                 Shin, Lucila

## 2020-05-12 ENCOUNTER — Other Ambulatory Visit: Payer: Medicaid Other

## 2020-05-12 DIAGNOSIS — Z20822 Contact with and (suspected) exposure to covid-19: Secondary | ICD-10-CM

## 2020-05-13 LAB — SARS-COV-2, NAA 2 DAY TAT

## 2020-05-13 LAB — NOVEL CORONAVIRUS, NAA: SARS-CoV-2, NAA: NOT DETECTED

## 2020-07-29 ENCOUNTER — Other Ambulatory Visit: Payer: Self-pay

## 2020-07-29 ENCOUNTER — Ambulatory Visit
Admission: EM | Admit: 2020-07-29 | Discharge: 2020-07-29 | Disposition: A | Discharge status: Home / Self Care | Payer: Medicaid Other | Attending: Nurse Practitioner | Admitting: Nurse Practitioner

## 2020-07-29 DIAGNOSIS — S39012A Strain of muscle, fascia and tendon of lower back, initial encounter: Secondary | ICD-10-CM

## 2020-07-29 DIAGNOSIS — M5432 Sciatica, left side: Secondary | ICD-10-CM | POA: Diagnosis not present

## 2020-07-29 MED ORDER — METHOCARBAMOL 500 MG PO TABS
500.0000 mg | ORAL_TABLET | Freq: Three times a day (TID) | ORAL | 0 refills | Status: DC | PRN
Start: 2020-07-29 — End: 2020-08-09

## 2020-07-29 MED ORDER — NAPROXEN 500 MG PO TABS
500.0000 mg | ORAL_TABLET | Freq: Two times a day (BID) | ORAL | 0 refills | Status: AC
Start: 2020-07-29 — End: 2020-08-08

## 2020-07-29 MED ORDER — PREDNISONE 10 MG (21) PO TBPK
ORAL_TABLET | Freq: Every day | ORAL | 0 refills | Status: DC
Start: 2020-07-29 — End: 2020-08-27

## 2020-07-29 NOTE — Discharge Instructions (Signed)
Sciatica is pain, weakness, tingling, or loss of feeling (numbness) along the sciatic nerve. The sciatic nerve starts in the lower back and goes down the back of each leg. Sciatica usually goes away on its own or with treatment. Sometimes, sciatica may come back (recur). This condition often gets better without any treatment. However, treatment may include: Changing or cutting back on physical activity when you have pain. Doing exercises and stretching. Putting ice or heat on the affected area. Take medications as prescribed which will help relieve pain and relax your muscles

## 2020-07-29 NOTE — ED Triage Notes (Signed)
Pt c/o lower back pain radiating down lt leg for over a week. Denies injury. States taking ibuprofen and aleve with relief.

## 2020-07-29 NOTE — ED Provider Notes (Addendum)
[facility]    CSN: [id number] Arrival date & time: 07/29/20  0825      History   Chief Complaint Chief Complaint  Patient presents with  . Back Pain    HPI Kathleen Cooke is a 25 y.o. female.   Subjective:  Kathleen Cooke is a 25 y.o. female who presents for evaluation of low back pain. The patient has had no prior back problems. Symptoms have been present for 1 week. Symptoms have been waxing and waning since onset.  Onset was related to / precipitated by no known injury; however, the patient reports a lot of lifting at work.  The pain is located across the lower back with radiation down the left buttocks and thigh. The radiating pain down the left buttock/thigh is described as a tingling sensation as if the leg "falls asleep." The pain across the low back is described as aching and occurs intermittently. She rates her pain as a 10 on a scale of 0-10 at its worst.  Symptoms are exacerbated by extension, flexion and lifting. She has tried ibuprofen which provided some relief in her symptoms but the symptomatic relief does not last very long. She denies any urinary/bowel incontinence, groin pain/numbness, hematuria, dysuria, nausea, vomiting, abdominal pain or problems ambulating. The patient has no "red flag" history indicative of complicated back pain.  The following portions of the patient's history were reviewed and updated as appropriate: allergies, current medications, past family history, past medical history, past social history, past surgical history and problem list.       Past Medical History:  Diagnosis Date  . Asthma   . Headache     Patient Active Problem List   Diagnosis Date Noted  . Indication for care in labor or delivery 03/08/2019  . 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 Cholestasis during pregnancy 02/25/2018  . Elevated transaminase level 02/21/2018  . Asthma 01/16/2018  . Abnormal genetic test 11/06/2017  .  Abnormal MSAFP (maternal serum alpha-fetoprotein), elevated 11/06/2017  . Anemia 11/06/2017  . Supervision of other normal pregnancy, antepartum 10/25/2017    Past Surgical History:  Procedure Laterality Date  . NO PAST SURGERIES      OB History    Gravida  3   Para  3   Term  3   Preterm  0   AB  0   Living  3     SAB  0   IAB  0   Ectopic  0   Multiple  0   Live Births  3            Home Medications    Prior to Admission medications   Medication Sig Start Date End Date Taking? Authorizing Provider  methocarbamol (ROBAXIN) 500 MG tablet Take 1 tablet (500 mg total) by mouth every 8 (eight) hours as needed for muscle spasms. 07/29/20  Yes Lurline IdolMurrill, Murad Staples, FNP  naproxen (NAPROSYN) 500 MG tablet Take 1 tablet (500 mg total) by mouth 2 (two) times daily for 10 days. 07/29/20 08/08/20 Yes Lurline IdolMurrill, Larren Copes, FNP  predniSONE (STERAPRED UNI-PAK 21 TAB) 10 MG (21) TBPK tablet Take by mouth daily. Take 6 tabs by mouth daily  for 2 days, then 5 tabs for 2 days, then 4 tabs for 2 days, then 3 tabs for 2 days, 2 tabs for 2 days, then 1 tab by mouth daily for 2 days 07/29/20  Yes Lurline IdolMurrill, Chalise Pe, FNP  ferrous sulfate 325 (65 FE) MG tablet Take 1 tablet (  325 mg total) by mouth daily with breakfast. 03/09/19 07/07/19  Carrington Clamp, MD    Family History Family History  Problem Relation Age of Onset  . Birth defects Neg Hx   . Diabetes Neg Hx   . Early death Neg Hx   . Heart disease Neg Hx   . Hypertension Neg Hx   . Stroke Neg Hx   . Miscarriages / Stillbirths Neg Hx     Social History Social History   Tobacco Use  . Smoking status: Never Smoker  . Smokeless tobacco: Never Used  Vaping Use  . Vaping Use: Never used  Substance Use Topics  . Alcohol use: No  . Drug use: Not Currently    Types: Marijuana    Comment: used cocaine 1.5 yrs ago      Allergies   Patient has no known allergies.   Review of Systems Review of Systems  Constitutional: Negative  for fever.  Genitourinary: Negative.   Musculoskeletal: Positive for back pain. Negative for gait problem.  Neurological: Negative for weakness and numbness.  All other systems reviewed and are negative.    Physical Exam Triage Vital Signs ED Triage Vitals  Enc Vitals Group     BP 07/29/20 0934 114/69     Pulse Rate 07/29/20 0934 83     Resp 07/29/20 0934 18     Temp 07/29/20 0934 98.4 F (36.9 C)     Temp Source 07/29/20 0934 Oral     SpO2 07/29/20 0934 96 %     Weight --      Height --      Head Circumference --      Peak Flow --      Pain Score 07/29/20 0935 10     Pain Loc --      Pain Edu? --      Excl. in GC? --    No data found.  Updated Vital Signs BP 114/69 (BP Location: Left Arm)   Pulse 83   Temp 98.4 F (36.9 C) (Oral)   Resp 18   LMP 07/01/2020   SpO2 96%   Breastfeeding No   Visual Acuity Right Eye Distance:   Left Eye Distance:   Bilateral Distance:    Right Eye Near:   Left Eye Near:    Bilateral Near:     Physical Exam Vitals reviewed.  Constitutional:      General: She is not in acute distress.    Appearance: Normal appearance. She is not ill-appearing or toxic-appearing.  HENT:     Head: Normocephalic.  Cardiovascular:     Rate and Rhythm: Normal rate and regular rhythm.  Pulmonary:     Effort: Pulmonary effort is normal.     Breath sounds: Normal breath sounds.  Musculoskeletal:     Cervical back: Normal, normal range of motion and neck supple.     Thoracic back: Normal.     Lumbar back: Tenderness present. No swelling, deformity or bony tenderness. Normal range of motion. Positive left straight leg raise test. No scoliosis.  Skin:    General: Skin is warm and dry.  Neurological:     General: No focal deficit present.     Mental Status: She is alert and oriented to person, place, and time.  Psychiatric:        Mood and Affect: Mood normal.      UC Treatments / Results  Labs (all labs ordered are listed, but only  abnormal results are displayed) Labs  Reviewed - No data to display  EKG   Radiology No results found.  Procedures Procedures (including critical care time)  Medications Ordered in UC Medications - No data to display  Initial Impression / Assessment and Plan / UC Course  I have reviewed the triage vital signs and the nursing notes.  Pertinent labs & imaging results that were available during my care of the patient were reviewed by me and considered in my medical decision making (see chart for details).    25 year old female presenting with a 1 week history of low back pain with radiation down the left buttocks and thigh.  No specific injury; however, the patient does a lot of lifting at work.  No prior history of back problems.  She has had minimal relief with the use of ibuprofen.  No red flag history indicative of complicated back pain or cauda equina.  Patient alert and oriented x3.  Afebrile.  Uncomfortable but nontoxic.  Physical exam suggestive of acute muscle strain with some radiculopathy to the left lower extremity.  Will treat for possible lumbar strain with sciatica. Natural history and expected course discussed. Agricultural engineer distributed. Proper lifting, bending technique discussed. Stretching exercises discussed. Short (2-4 day) period of relative rest recommended until acute symptoms improve. Heat to affected area as needed for local pain relief. NSAIDs per medication orders. Muscle relaxants per medication orders.  Short burst of steroids per medication orders.  Discussed indications for immediate follow-up.  Today's evaluation has revealed no signs of a dangerous process. Discussed diagnosis with patient and/or guardian. Patient and/or guardian aware of their diagnosis, possible red flag symptoms to watch out for and need for close follow up. Patient and/or guardian understands verbal and written discharge instructions. Patient and/or guardian comfortable with plan and  disposition.  Patient and/or guardian has a clear mental status at this time, good insight into illness (after discussion and teaching) and has clear judgment to make decisions regarding their care  This care was provided during an unprecedented National Emergency due to the Novel Coronavirus (COVID-19) pandemic. COVID-19 infections and transmission risks place heavy strains on healthcare resources.  As this pandemic evolves, our facility, providers, and staff strive to respond fluidly, to remain operational, and to provide care relative to available resources and information. Outcomes are unpredictable and treatments are without well-defined guidelines. Further, the impact of COVID-19 on all aspects of urgent care, including the impact to patients seeking care for reasons other than COVID-19, is unavoidable during this national emergency. At this time of the global pandemic, management of patients has significantly changed, even for non-COVID positive patients given high local and regional COVID volumes at this time requiring high healthcare system and resource utilization. The standard of care for management of both COVID suspected and non-COVID suspected patients continues to change rapidly at the local, regional, national, and global levels. This patient was worked up and treated to the best available but ever changing evidence and resources available at this current time.   Documentation was completed with the aid of voice recognition software. Transcription may contain typographical errors.  Final Clinical Impressions(s) / UC Diagnoses   Final diagnoses:  Acute myofascial strain of lumbar region, initial encounter  Sciatica of left side     Discharge Instructions     Sciatica is pain, weakness, tingling, or loss of feeling (numbness) along the sciatic nerve. The sciatic nerve starts in the lower back and goes down the back of each leg. Sciatica usually goes away on  its own or with treatment.  Sometimes, sciatica may come back (recur). This condition often gets better without any treatment. However, treatment may include: 1. Changing or cutting back on physical activity when you have pain. 2. Doing exercises and stretching. 3. Putting ice or heat on the affected area. 4. Take medications as prescribed which will help relieve pain and relax your muscles     ED Prescriptions    Medication Sig Dispense Auth. Provider   naproxen (NAPROSYN) 500 MG tablet Take 1 tablet (500 mg total) by mouth 2 (two) times daily for 10 days. 20 tablet Lurline Idol, FNP   methocarbamol (ROBAXIN) 500 MG tablet Take 1 tablet (500 mg total) by mouth every 8 (eight) hours as needed for muscle spasms. 20 tablet Garon Melander, Lelon Mast, FNP   predniSONE (STERAPRED UNI-PAK 21 TAB) 10 MG (21) TBPK tablet Take by mouth daily. Take 6 tabs by mouth daily  for 2 days, then 5 tabs for 2 days, then 4 tabs for 2 days, then 3 tabs for 2 days, 2 tabs for 2 days, then 1 tab by mouth daily for 2 days 42 tablet Lurline Idol, FNP     PDMP not reviewed this encounter.   Lurline Idol, FNP 07/29/20 1003    Cathlean Sauer [place], [place] 07/30/20 1424

## 2020-08-08 ENCOUNTER — Encounter (HOSPITAL_COMMUNITY): Payer: Self-pay | Admitting: Emergency Medicine

## 2020-08-08 ENCOUNTER — Inpatient Hospital Stay (HOSPITAL_COMMUNITY)
Admission: EM | Admit: 2020-08-08 | Discharge: 2020-08-09 | Disposition: A | Discharge status: Home / Self Care | Payer: Medicaid Other | Attending: Obstetrics | Admitting: Obstetrics

## 2020-08-08 ENCOUNTER — Other Ambulatory Visit: Payer: Self-pay

## 2020-08-08 DIAGNOSIS — O26891 Other specified pregnancy related conditions, first trimester: Secondary | ICD-10-CM | POA: Diagnosis not present

## 2020-08-08 DIAGNOSIS — O23591 Infection of other part of genital tract in pregnancy, first trimester: Secondary | ICD-10-CM | POA: Diagnosis not present

## 2020-08-08 DIAGNOSIS — O26899 Other specified pregnancy related conditions, unspecified trimester: Secondary | ICD-10-CM | POA: Diagnosis present

## 2020-08-08 DIAGNOSIS — O23593 Infection of other part of genital tract in pregnancy, third trimester: Secondary | ICD-10-CM | POA: Diagnosis not present

## 2020-08-08 DIAGNOSIS — R109 Unspecified abdominal pain: Secondary | ICD-10-CM

## 2020-08-08 DIAGNOSIS — Z3A01 Less than 8 weeks gestation of pregnancy: Secondary | ICD-10-CM | POA: Insufficient documentation

## 2020-08-08 DIAGNOSIS — R102 Pelvic and perineal pain: Secondary | ICD-10-CM | POA: Diagnosis present

## 2020-08-08 DIAGNOSIS — O208 Other hemorrhage in early pregnancy: Secondary | ICD-10-CM | POA: Diagnosis not present

## 2020-08-08 DIAGNOSIS — O468X1 Other antepartum hemorrhage, first trimester: Secondary | ICD-10-CM

## 2020-08-08 DIAGNOSIS — B9689 Other specified bacterial agents as the cause of diseases classified elsewhere: Secondary | ICD-10-CM | POA: Insufficient documentation

## 2020-08-08 DIAGNOSIS — O418X10 Other specified disorders of amniotic fluid and membranes, first trimester, not applicable or unspecified: Secondary | ICD-10-CM

## 2020-08-08 DIAGNOSIS — N760 Acute vaginitis: Secondary | ICD-10-CM

## 2020-08-08 DIAGNOSIS — R1032 Left lower quadrant pain: Secondary | ICD-10-CM | POA: Insufficient documentation

## 2020-08-08 DIAGNOSIS — Z3490 Encounter for supervision of normal pregnancy, unspecified, unspecified trimester: Secondary | ICD-10-CM

## 2020-08-08 DIAGNOSIS — O209 Hemorrhage in early pregnancy, unspecified: Secondary | ICD-10-CM | POA: Diagnosis not present

## 2020-08-08 NOTE — ED Triage Notes (Signed)
Patient reports LLQ abdominal pain  Radiating to left lower back this week , no emesis or diarrhea , denies fever or chills.

## 2020-08-09 ENCOUNTER — Encounter (HOSPITAL_COMMUNITY): Payer: Self-pay | Admitting: *Deleted

## 2020-08-09 ENCOUNTER — Inpatient Hospital Stay (HOSPITAL_COMMUNITY): Payer: Medicaid Other

## 2020-08-09 DIAGNOSIS — N760 Acute vaginitis: Secondary | ICD-10-CM

## 2020-08-09 DIAGNOSIS — O209 Hemorrhage in early pregnancy, unspecified: Secondary | ICD-10-CM

## 2020-08-09 DIAGNOSIS — O26891 Other specified pregnancy related conditions, first trimester: Secondary | ICD-10-CM | POA: Diagnosis not present

## 2020-08-09 DIAGNOSIS — O26899 Other specified pregnancy related conditions, unspecified trimester: Secondary | ICD-10-CM | POA: Diagnosis present

## 2020-08-09 DIAGNOSIS — R1032 Left lower quadrant pain: Secondary | ICD-10-CM | POA: Diagnosis present

## 2020-08-09 DIAGNOSIS — R109 Unspecified abdominal pain: Secondary | ICD-10-CM

## 2020-08-09 DIAGNOSIS — B9689 Other specified bacterial agents as the cause of diseases classified elsewhere: Secondary | ICD-10-CM

## 2020-08-09 DIAGNOSIS — R102 Pelvic and perineal pain: Secondary | ICD-10-CM | POA: Diagnosis present

## 2020-08-09 DIAGNOSIS — Z3A01 Less than 8 weeks gestation of pregnancy: Secondary | ICD-10-CM

## 2020-08-09 DIAGNOSIS — O23593 Infection of other part of genital tract in pregnancy, third trimester: Secondary | ICD-10-CM

## 2020-08-09 LAB — I-STAT BETA HCG BLOOD, ED (MC, WL, AP ONLY): I-stat hCG, quantitative: >2000 m[IU]/mL — ABNORMAL HIGH (ref <5)

## 2020-08-09 LAB — GC/CHLAMYDIA PROBE AMP (~~LOC~~) NOT AT ARMC
Chlamydia: NEGATIVE
Comment: NEGATIVE
Comment: NORMAL
Neisseria Gonorrhea: NEGATIVE

## 2020-08-09 LAB — CBC
HCT: 36.6 % (ref 36.0–46.0)
Hemoglobin: 11.7 g/dL — ABNORMAL LOW (ref 12.0–15.0)
MCH: 27.3 pg (ref 26.0–34.0)
MCHC: 32 g/dL (ref 30.0–36.0)
MCV: 85.3 fL (ref 80.0–100.0)
Platelets: 213 10*3/uL (ref 150–400)
RBC: 4.29 MIL/uL (ref 3.87–5.11)
RDW: 13.2 % (ref 11.5–15.5)
WBC: 9.1 10*3/uL (ref 4.0–10.5)
nRBC: 0 % (ref 0.0–0.2)

## 2020-08-09 LAB — COMPREHENSIVE METABOLIC PANEL
ALT: 113 U/L — ABNORMAL HIGH (ref 0–44)
AST: 87 U/L — ABNORMAL HIGH (ref 15–41)
Albumin: 3.1 g/dL — ABNORMAL LOW (ref 3.5–5.0)
Alkaline Phosphatase: 86 U/L (ref 38–126)
Anion gap: 10 (ref 5–15)
BUN: 6 mg/dL (ref 6–20)
CO2: 19 mmol/L — ABNORMAL LOW (ref 22–32)
Calcium: 8.3 mg/dL — ABNORMAL LOW (ref 8.9–10.3)
Chloride: 107 mmol/L (ref 98–111)
Creatinine, Ser: 0.45 mg/dL (ref 0.44–1.00)
GFR, Estimated: >60 mL/min (ref >60)
Glucose, Bld: 100 mg/dL — ABNORMAL HIGH (ref 70–99)
Potassium: 3.7 mmol/L (ref 3.5–5.1)
Sodium: 136 mmol/L (ref 135–145)
Total Bilirubin: 0.7 mg/dL (ref 0.3–1.2)
Total Protein: 6.2 g/dL — ABNORMAL LOW (ref 6.5–8.1)

## 2020-08-09 LAB — URINALYSIS, ROUTINE W REFLEX MICROSCOPIC
Bilirubin Urine: NEGATIVE
Glucose, UA: NEGATIVE mg/dL
Hgb urine dipstick: NEGATIVE
Ketones, ur: NEGATIVE mg/dL
Leukocytes,Ua: NEGATIVE
Nitrite: NEGATIVE
Protein, ur: NEGATIVE mg/dL
Specific Gravity, Urine: 1.018 (ref 1.005–1.030)
pH: 5 (ref 5.0–8.0)

## 2020-08-09 LAB — WET PREP, GENITAL
Sperm: NONE SEEN
Trich, Wet Prep: NONE SEEN
Yeast Wet Prep HPF POC: NONE SEEN

## 2020-08-09 LAB — ABO/RH: ABO/RH(D): O POS

## 2020-08-09 LAB — HCG, QUANTITATIVE, PREGNANCY: hCG, Beta Chain, Quant, S: 73799 m[IU]/mL — ABNORMAL HIGH (ref <5)

## 2020-08-09 LAB — LIPASE, BLOOD: Lipase: 32 U/L (ref 11–51)

## 2020-08-09 MED ORDER — METRONIDAZOLE 500 MG PO TABS
500.0000 mg | ORAL_TABLET | Freq: Two times a day (BID) | ORAL | 0 refills | Status: DC
Start: 2020-08-09 — End: 2020-08-27

## 2020-08-09 NOTE — Discharge Instructions (Signed)
Subchorionic Hematoma  A hematoma is a collection of blood outside of the blood vessels. A subchorionic hematoma is a collection of blood between the outer wall of the embryo (chorion) and the inner wall of the uterus. This condition can cause vaginal bleeding. Early small hematomas usually shrink on their own and do not affect your baby or pregnancy. When bleeding starts later in pregnancy, or if the hematoma is larger or occurs in older pregnant women, the condition may be more serious. Larger hematomas increase the chances of miscarriage. This condition also increases the risk of:  Premature separation of the placenta from the uterus.  Premature (preterm) labor.  Stillbirth. What are the causes? The exact cause of this condition is not known. It occurs when blood is trapped between the placenta and the uterine wall because the placenta has separated from the original site of implantation. What increases the risk? You are more likely to develop this condition if:  You were treated with fertility medicines.  You became pregnant through in vitro fertilization (IVF). What are the signs or symptoms? Symptoms of this condition include:  Vaginal spotting or bleeding.  Abdominal pain. This is rare. Sometimes you may have no symptoms and the bleeding may only be seen when ultrasound images are taken (transvaginal ultrasound). How is this diagnosed? This condition is diagnosed based on a physical exam. This includes a pelvic exam. You may also have other tests, including:  Blood tests.  Urine tests.  Ultrasound of the abdomen. How is this treated? Treatment for this condition can vary. Treatment may include:  Watchful waiting. You will be monitored closely for any changes in bleeding.  Medicines.  Activity restriction. This may be needed until the bleeding stops.  A medicine called Rh immunoglobulin. This is given if you have an Rh-negative blood type. It prevents Rh  sensitization. Follow these instructions at home:  Stay on bed rest if told to do so by your health care provider.  Do not lift anything that is heavier than 10 lb (4.5 kg), or the limit that you are told by your health care provider.  Track and write down the number of pads you use each day and how soaked (saturated) they are.  Do not use tampons.  Keep all follow-up visits. This is important. Your health care provider may ask you to have follow-up blood tests or ultrasound tests or both. Contact a health care provider if:  You have any vaginal bleeding.  You have a fever. Get help right away if:  You have severe cramps in your stomach, back, abdomen, or pelvis.  You pass large clots or tissue. Save any tissue for your health care provider to look at.  You faint.  You become light-headed or weak. Summary  A subchorionic hematoma is a collection of blood between the outer wall of the embryo (chorion) and the inner wall of the uterus.  This condition can cause vaginal bleeding.  Sometimes you may have no symptoms and the bleeding may only be seen when ultrasound images are taken.  Treatment may include watchful waiting, medicines, or activity restriction.  Keep all follow-up visits. Get help right away if you have severe cramps or heavy vaginal bleeding. This information is not intended to replace advice given to you by your health care provider. Make sure you discuss any questions you have with your health care provider. Document Revised: 12/15/2019 Document Reviewed: 12/15/2019 Elsevier Patient Education  2021 Elsevier Inc.   

## 2020-08-09 NOTE — ED Notes (Signed)
MAU notified by Ed RN on patient's status .

## 2020-08-09 NOTE — MAU Provider Note (Signed)
Patient Kassidie Hendriks is a 25 y.o. [phone]  at [redacted]w[redacted]d here with complaints of light pain for three weeks, but then a few days ago it got worse. She denies vaginal bleeding, vaginal discharge, dysuria, fever, SOB.  She denies nausea, vomiting, constipation, diarrhea.   She was seen in the [facility] and found to be pregnant, and transferred here for ectopic work-up.  History     CSN: [id number]  Arrival date and time: 08/08/20 2219   None     Chief Complaint  Patient presents with  . Abdominal Pain   Abdominal Pain This is a chronic problem. The current episode started in the past 7 days. The problem occurs intermittently. The problem has been gradually worsening. The pain is located in the LLQ. The pain is at a severity of 2/10. The quality of the pain is cramping. Pertinent negatives include no constipation, diarrhea, fever, nausea or vomiting.    OB History    Gravida  4   Para  3   Term  3   Preterm  0   AB  0   Living  3     SAB  0   IAB  0   Ectopic  0   Multiple  0   Live Births  3           Past Medical History:  Diagnosis Date  . Asthma   . Headache     Past Surgical History:  Procedure Laterality Date  . NO PAST SURGERIES      Family History  Problem Relation Age of Onset  . Birth defects Neg Hx   . Diabetes Neg Hx   . Early death Neg Hx   . Heart disease Neg Hx   . Hypertension Neg Hx   . Stroke Neg Hx   . Miscarriages / Stillbirths Neg Hx     Social History   Tobacco Use  . Smoking status: Never Smoker  . Smokeless tobacco: Never Used  Vaping Use  . Vaping Use: Never used  Substance Use Topics  . Alcohol use: No  . Drug use: Not Currently    Types: Marijuana    Comment: used cocaine 1.5 yrs ago     Allergies: No Known Allergies  No medications prior to admission.    Review of Systems  Constitutional: Negative.  Negative for fever.  HENT: Negative.   Respiratory: Negative.   Cardiovascular: Negative.    Gastrointestinal: Positive for abdominal pain. Negative for constipation, diarrhea, nausea and vomiting.  Genitourinary: Negative.    Physical Exam   Blood pressure (!) 98/53, pulse 76, temperature 98.3 F (36.8 C), temperature source Oral, resp. rate 18, last menstrual period 07/01/2020, SpO2 100 %, not currently breastfeeding.  Physical Exam Constitutional:      Appearance: She is well-developed.  Abdominal:     General: Abdomen is flat.     Palpations: Abdomen is soft.     Tenderness: There is no abdominal tenderness.  Neurological:     Mental Status: She is alert.   Pelvic exam deferred  MAU Course  Procedures  MDM -Blood type O pos -I have independently reviewed the [place] images, which reveal finding of IUP with cardiac activity. [facility] noted -wet prep shows clue cells  -bhcg is 73,000.   Assessment and Plan   1. Abdominal pain   2. Intrauterine pregnancy   3. Bacterial vaginosis   4. Subchorionic hemorrhage of placenta in first trimester, single or unspecified fetus   -discussed implications  of [facility], including pelvic rest and bleeding return precautions -start care at [facility] -GC CT pending -return to MAU as needed Marylene Land 08/09/2020, 7:44 AM

## 2020-08-09 NOTE — ED Provider Notes (Signed)
Emergency Medicine Provider OB Triage Evaluation Note  Kathleen Cooke is a 25 y.o. female, [phone], at Unknown gestation who presents to the emergency department with complaints of LLQ abdominal pain. Pain mild x 2 weeks, worsening x 48 hours. LMP in March 2022. No fevers, urinary symptoms, vaginal bleeding or discharge.  Review of  Systems  Positive: abdominal pain Negative: fever  Physical Exam  BP 111/67 (BP Location: Left Arm)   Pulse 80   Temp 98.3 F (36.8 C) (Oral)   Resp 16   SpO2 100%  General: Awake, no distress  HEENT: Atraumatic  Resp: Normal effort  Cardiac: Normal rate MSK: Moves all extremities without difficulty Neuro: Speech clear  Medical Decision Making  Pt evaluated for pregnancy concern and is stable for transfer to MAU. Pt is in agreement with plan for transfer.  2:11 AM Discussed with MAU APP, Santina Evans, who accepts patient in transfer.  Clinical Impression   1. Left lower quadrant abdominal pain        Antony Madura, PA-C 08/09/20 0211    Gilda Crease, MD 08/09/20 0530

## 2020-08-09 NOTE — MAU Note (Incomplete)
Pt transferred from [facility] with c/o LLQ pain x 2 weeks Denies any vag bleeding or discharge.

## 2020-08-26 ENCOUNTER — Ambulatory Visit (INDEPENDENT_AMBULATORY_CARE_PROVIDER_SITE_OTHER): Payer: Medicaid Other | Admitting: Certified Nurse Midwife

## 2020-08-26 ENCOUNTER — Encounter: Payer: Self-pay | Admitting: *Deleted

## 2020-08-26 ENCOUNTER — Other Ambulatory Visit: Payer: Self-pay

## 2020-08-26 ENCOUNTER — Encounter: Payer: Self-pay | Admitting: Certified Nurse Midwife

## 2020-08-26 VITALS — BP 107/67 | HR 70 | Wt 142.5 lb

## 2020-08-26 DIAGNOSIS — Z3490 Encounter for supervision of normal pregnancy, unspecified, unspecified trimester: Secondary | ICD-10-CM | POA: Insufficient documentation

## 2020-08-26 DIAGNOSIS — Z3480 Encounter for supervision of other normal pregnancy, unspecified trimester: Secondary | ICD-10-CM | POA: Diagnosis not present

## 2020-08-26 DIAGNOSIS — Z3A11 11 weeks gestation of pregnancy: Secondary | ICD-10-CM

## 2020-08-26 NOTE — Progress Notes (Signed)
Patient complains of pain on left side of pelvis

## 2020-08-27 ENCOUNTER — Encounter: Payer: Self-pay | Admitting: Certified Nurse Midwife

## 2020-08-27 LAB — CBC/D/PLT+RPR+RH+ABO+RUBIGG...
Antibody Screen: NEGATIVE
Basophils Absolute: 0 10*3/uL (ref 0.0–0.2)
Basos: 1 %
EOS (ABSOLUTE): 0.1 10*3/uL (ref 0.0–0.4)
Eos: 2 %
HCV Ab: <0.1 s/co ratio (ref 0.0–0.9)
HIV Screen 4th Generation wRfx: NONREACTIVE
Hematocrit: 35 % (ref 34.0–46.6)
Hemoglobin: 11.2 g/dL (ref 11.1–15.9)
Hepatitis B Surface Ag: NEGATIVE
Immature Grans (Abs): 0 10*3/uL (ref 0.0–0.1)
Immature Granulocytes: 0 %
Lymphocytes Absolute: 3.1 10*3/uL (ref 0.7–3.1)
Lymphs: 49 %
MCH: 26.8 pg (ref 26.6–33.0)
MCHC: 32 g/dL (ref 31.5–35.7)
MCV: 84 fL (ref 79–97)
Monocytes Absolute: 0.3 10*3/uL (ref 0.1–0.9)
Monocytes: 4 %
Neutrophils Absolute: 2.8 10*3/uL (ref 1.4–7.0)
Neutrophils: 44 %
Platelets: 267 10*3/uL (ref 150–450)
RBC: 4.18 x10E6/uL (ref 3.77–5.28)
RDW: 14.6 % (ref 11.7–15.4)
RPR Ser Ql: NONREACTIVE
Rh Factor: POSITIVE
Rubella Antibodies, IGG: 11.2 index (ref >0.99)
WBC: 6.4 10*3/uL (ref 3.4–10.8)

## 2020-08-27 LAB — HEMOGLOBIN A1C
Est. average glucose Bld gHb Est-mCnc: 94 mg/dL
Hgb A1c MFr Bld: 4.9 % (ref 4.8–5.6)

## 2020-08-27 LAB — HCV INTERPRETATION

## 2020-08-27 MED ORDER — PREPLUS 27-1 MG PO TABS: 1.0000 | ORAL_TABLET | Freq: Every day | ORAL | 13 refills | Status: DC

## 2020-08-27 NOTE — Progress Notes (Signed)
History:   Kathleen Cooke is a 25 y.o. [phone] at [redacted]w[redacted]d by LMP, early ultrasound being seen today for her first obstetrical visit.  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 Patient does intend attempt to breast feed. Pregnancy history fully reviewed.  Patient reports some pain upon movement on the left side of her pelvis.      HISTORY: OB History  Gravida Para Term Preterm AB Living  4 3 3  0 0 3  SAB IAB Ectopic Multiple Live Births  0 0 0 0 3    # Outcome Date GA Lbr Len/2nd Weight Sex Delivery Anes PTL Lv  4 Current           3 Term 03/08/19 [redacted]w[redacted]d 08:02 / 00:12 8 lb 9.6 oz (3.9 kg) M Vag-Spont EPI  LIV     Name: WESTON, FULCO     Apgar1: 9  Apgar5: 9  2 Term 03/08/18 [redacted]w[redacted]d 04:24 / 00:48 6 lb 7.5 oz (2.934 kg) F Vag-Spont EPI  LIV     Birth Comments: small abrasion on inner left forearm      Name: [redacted]w[redacted]d     Apgar1: 9  Apgar5: 9  1 Term 08/22/15 [redacted]w[redacted]d 08:04 / 01:48 7 lb 7.9 oz (3.4 kg) M Vag-Spont EPI  LIV     Name: JULANN, MCGILVRAY     Apgar1: 9  Apgar5: 9    Last pap smear was done July 2019 and was normal  Past Medical History:  Diagnosis Date  . 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 Abnormal MSAFP (maternal serum alpha-fetoprotein), elevated 11/06/2017   Elevated MSAFP> genetic testing ordered for 11-09-2017 Weekly BPP at 32 weeks [ ]  (per MFM) (end of Oct) Fup for growth at 33 weeks (end of Oct) -message sent to clinic to make sure patient has appt; has not been reliable with keeping appts.   . Anemia 11/06/2017   Started on iron BID  . Asthma   . Cholestasis during pregnancy 02/25/2018  . 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 Elevated transaminase level 02/21/2018  . Headache    Past Surgical History:  Procedure Laterality Date  . NO PAST SURGERIES     Family History  Problem Relation Age of Onset  . Birth defects Neg Hx    . Diabetes Neg Hx   . Early death Neg Hx   . Heart disease Neg Hx   . Hypertension Neg Hx   . Stroke Neg Hx   . Miscarriages / Stillbirths Neg Hx    Social History   Tobacco Use  . Smoking status: Never Smoker  . Smokeless tobacco: Never Used  Vaping Use  . Vaping Use: Never used  Substance Use Topics  . Alcohol use: No  . Drug use: Not Currently    Types: Marijuana    Comment: used cocaine 1.5 yrs ago    No Known Allergies Current Outpatient Medications on File Prior to Visit  Medication Sig Dispense Refill  . [DISCONTINUED] ferrous sulfate 325 (65 FE) MG tablet Take 1 tablet (325 mg total) by mouth daily with breakfast. 90 tablet 3   No current facility-administered medications on file prior to visit.   Review of Systems Pertinent items noted in HPI and remainder of comprehensive ROS otherwise negative. Physical Exam:   Vitals:   08/26/20 0914  BP: 107/67  Pulse: 70  Weight: 142 lb 8 oz (64.6  kg)   Fetal Heart Rate (bpm): 155  Constitutional: Well-developed, well-nourished pregnant female in no acute distress.  HEENT: PERRLA Skin: normal color and turgor, no rash Cardiovascular: normal rate & rhythm, no murmur Respiratory: normal effort, lung sounds clear throughout GI: Abd soft, non-tender, pos BS x 4, gravid appropriate for gestational age MS: Extremities nontender, no edema, normal ROM Neurologic: Alert and oriented x 4.  GU: no CVA tenderness Pelvic exam deferred  Assessment:    Pregnancy: [id number] Patient Active Problem List   Diagnosis Date Noted  . Supervision of low-risk pregnancy 08/26/2020  . Pain of round ligament affecting pregnancy, antepartum 08/09/2020  . Asthma 01/16/2018     Plan:    1. Encounter for supervision of low-risk pregnancy, antepartum Doing well, not feeling fetal movement yet - CBC/D/Plt+RPR+Rh+ABO+RubIgG... - Genetic Screening - Hemoglobin A1c - [facility] AMB BABYSCRIPTS SCHEDULE OPTIMIZATION - Culture, OB Urine - [place] MFM  OB COMP + 14 WK; Future - AMBULATORY REFERRAL TO [facility]  2. [redacted] weeks gestation of pregnancy Routine new OB care including: - Initial labs drawn. - Prescribed prenatal vitamins. - Problem list reviewed and updated. - Genetic Screening discussed, First trimester screen, Quad screen and NIPS: ordered. - Ultrasound discussed; fetal anatomic survey: ordered. - Anticipatory guidance about prenatal visits given including labs, ultrasounds, and testing. - Discussed usage of Babyscripts and virtual visits as additional source of managing and completing prenatal visits in midst of coronavirus and pandemic.   - Encouraged to complete MyChart Registration for her ability to review results, send requests, and have questions addressed.  - The nature of [place] [facility] with multiple MDs and Advanced Practice Providers was explained to patient; also emphasized that residents, students are part of our team. - Routine obstetric precautions reviewed. Encouraged to seek out care at office or emergency room [facility] MAU preferred) for urgent and/or emergent concerns.  3. Round ligament pain in pregnancy - Demonstrated round ligament massage to ease lower pelvic pain.  Follow up in 4 weeks.   Edd Arbour, MSN, CNM, IBCLC Certified Nurse Midwife, [facility]

## 2020-08-28 LAB — URINE CULTURE, OB REFLEX

## 2020-08-28 LAB — CULTURE, OB URINE

## 2020-08-31 ENCOUNTER — Telehealth: Payer: Self-pay | Admitting: *Deleted

## 2020-08-31 ENCOUNTER — Encounter: Payer: Self-pay | Admitting: *Deleted

## 2020-08-31 NOTE — Telephone Encounter (Signed)
Kathleen Cooke called front desk and asked for nurse to call her. I called her she reports she is taking medication for nausea and vomiting but can't keep anything down since yesterday. States she feels lightheaded, weak, dizzy. Advised to go to [facility] MAU for evaluation for dehydration/ need for IV fluids. She voices understanding. Keilyn Haggard,RN

## 2020-09-08 ENCOUNTER — Encounter: Payer: Self-pay | Admitting: *Deleted

## 2020-09-12 ENCOUNTER — Encounter (HOSPITAL_COMMUNITY): Payer: Self-pay | Admitting: Obstetrics and Gynecology

## 2020-09-12 ENCOUNTER — Other Ambulatory Visit: Payer: Self-pay

## 2020-09-12 ENCOUNTER — Inpatient Hospital Stay (HOSPITAL_COMMUNITY)
Admission: AD | Admit: 2020-09-12 | Discharge: 2020-09-12 | Disposition: A | Discharge status: Home / Self Care | Payer: Medicaid Other | Attending: Obstetrics and Gynecology | Admitting: Obstetrics and Gynecology

## 2020-09-12 DIAGNOSIS — O210 Mild hyperemesis gravidarum: Secondary | ICD-10-CM | POA: Diagnosis not present

## 2020-09-12 DIAGNOSIS — O219 Vomiting of pregnancy, unspecified: Secondary | ICD-10-CM | POA: Diagnosis not present

## 2020-09-12 DIAGNOSIS — Z3A13 13 weeks gestation of pregnancy: Secondary | ICD-10-CM

## 2020-09-12 LAB — URINALYSIS, ROUTINE W REFLEX MICROSCOPIC
Bacteria, UA: NONE SEEN
Bilirubin Urine: NEGATIVE
Glucose, UA: NEGATIVE mg/dL
Hgb urine dipstick: NEGATIVE
Ketones, ur: 80 mg/dL — AB
Nitrite: NEGATIVE
Protein, ur: NEGATIVE mg/dL
Specific Gravity, Urine: 1.019 (ref 1.005–1.030)
pH: 5 (ref 5.0–8.0)

## 2020-09-12 MED ORDER — SCOPOLAMINE 1 MG/3DAYS TD PT72: 1.0000 | MEDICATED_PATCH | TRANSDERMAL | 12 refills | Status: DC

## 2020-09-12 MED ORDER — ONDANSETRON 4 MG PO TBDP: 4.0000 mg | ORAL_TABLET | Freq: Three times a day (TID) | ORAL | 2 refills | Status: DC | PRN

## 2020-09-12 MED ORDER — ONDANSETRON 4 MG PO TBDP
8.0000 mg | ORAL_TABLET | Freq: Once | ORAL | Status: AC
  Administered 2020-09-12: 8 mg via ORAL
  Filled 2020-09-12: qty 2

## 2020-09-12 MED ORDER — PROMETHAZINE HCL 25 MG PO TABS: 25.0000 mg | ORAL_TABLET | Freq: Four times a day (QID) | ORAL | 2 refills | Status: DC | PRN

## 2020-09-12 MED ORDER — SCOPOLAMINE 1 MG/3DAYS TD PT72
1.0000 | MEDICATED_PATCH | TRANSDERMAL | Status: DC
  Administered 2020-09-12: 1.5 mg via TRANSDERMAL
  Filled 2020-09-12: qty 1

## 2020-09-12 NOTE — Progress Notes (Signed)
Patient stated that the nausea and vomiting has gotten much better.  Provider made aware. Patient given water and crackers to see if she can tolerate it.

## 2020-09-12 NOTE — MAU Note (Signed)
Patient arrived to MAU c/o nausea, vomiting and lower abdominal pain. Patient stated that a prescription was sent to her pharmacy but she has been unable to get it because the medication was unavailable.  NO VB, no LOF

## 2020-09-12 NOTE — MAU Provider Note (Signed)
History     CSN: [id number]  Arrival date and time: 09/12/20 4562   Event Date/Time   First Provider Initiated Contact with Patient 09/12/20 1658      Chief Complaint  Patient presents with   Nausea   Emesis   HPI Kathleen Cooke is a 25 y.o. [id number] at [redacted]w[redacted]d who presents with nausea and vomiting. She states she is unable to keep anything down. She does not have any antiemetics prescribed. She denies any pain, vaginal bleeding or discharge. She states she feels weak and tired from the vomiting.   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    Anemia 11/06/2017   Started on iron BID   Asthma    Cholestasis during pregnancy 02/25/2018   Cholestasis during pregnancy in third trimester 03/08/2018   Elevated transaminase level 02/21/2018   Headache     Past Surgical History:  Procedure Laterality Date   NO PAST SURGERIES      Family History  Problem Relation Age of Onset   Birth defects Neg Hx    Diabetes Neg Hx    Early death Neg Hx    Heart disease Neg Hx    Hypertension Neg Hx    Stroke Neg Hx    Miscarriages / Stillbirths Neg Hx     Social History   Tobacco Use   Smoking status: Never   Smokeless tobacco: Never  Vaping Use   Vaping Use: Never used  Substance Use Topics   Alcohol use: No   Drug use: Not Currently    Types: Marijuana    Comment: used cocaine 1.5 yrs ago     Allergies: No Known  Allergies  Medications Prior to Admission  Medication Sig Dispense Refill Last Dose   Prenatal Vit-Fe Fumarate-FA (PREPLUS) 27-1 MG TABS Take 1 tablet by mouth daily. 30 tablet 13 09/12/2020    Review of Systems  Constitutional: Negative.  Negative for fatigue and fever.  HENT: Negative.    Respiratory: Negative.  Negative for shortness of breath.   Cardiovascular: Negative.  Negative for chest pain.  Gastrointestinal:  Positive for nausea and vomiting. Negative for abdominal pain, constipation and diarrhea.  Genitourinary: Negative.  Negative for dysuria, vaginal bleeding and vaginal discharge.  Neurological: Negative.  Negative for dizziness and headaches.  Physical Exam   Blood pressure 105/62, pulse 99, temperature 98.8 F (37.1 C), resp. rate 17, last menstrual period 07/01/2020, SpO2 99 %, not currently breastfeeding.  Physical Exam Vitals and nursing note reviewed.  Constitutional:      General: She is not in acute distress.    Appearance: She is well-developed.  HENT:     Head: Normocephalic.  Eyes:     Pupils: Pupils are equal, round, and reactive to light.  Cardiovascular:     Rate and Rhythm: Normal rate and regular rhythm.     Heart sounds: Normal heart sounds.  Pulmonary:     Effort: Pulmonary effort is normal. No respiratory distress.     Breath sounds: Normal breath sounds.  Abdominal:     General: Bowel sounds are normal. There is no distension.     Palpations: Abdomen is soft.     Tenderness: There is no abdominal tenderness.  Skin:    General: Skin is warm and dry.  Neurological:     Mental Status: She is alert and oriented to person, place, and time.  Psychiatric:        Mood and Affect: Mood normal.        Behavior: Behavior normal.        Thought Content: Thought content normal.        Judgment: Judgment normal.   FHT:  159 bpm  MAU Course  Procedures Results for orders placed or performed during the hospital encounter of 09/12/20 (from the  past 24 hour(s))  Urinalysis, Routine w reflex microscopic     Status: Abnormal   Collection Time: 09/12/20  4:43 PM  Result Value Ref Range   Color, Urine YELLOW YELLOW   APPearance HAZY (A) CLEAR   Specific Gravity, Urine 1.019 1.005 - 1.030   pH 5.0 5.0 - 8.0   Glucose, UA NEGATIVE NEGATIVE mg/dL   Hgb urine dipstick NEGATIVE NEGATIVE   Bilirubin Urine NEGATIVE NEGATIVE   Ketones, ur 80 (A) NEGATIVE mg/dL   Protein, ur NEGATIVE NEGATIVE mg/dL   Nitrite NEGATIVE NEGATIVE   Leukocytes,Ua MODERATE (A) NEGATIVE   RBC / HPF 0-5 0 - 5 RBC/hpf   WBC, UA 6-10 0 - 5 WBC/hpf   Bacteria, UA NONE SEEN NONE SEEN   Squamous Epithelial / LPF 0-5 0 - 5   Mucus PRESENT     MDM UA, UC Zofran ODT Scop patch.   Discussed fluids vs PO hydration. Patient states she feels better after zofran and wants to try PO No episodes of vomiting while in MAU  Assessment and Plan   1. Nausea and vomiting during pregnancy   2. [redacted] weeks gestation of pregnancy    -Discharge home in stable condition -Rx for phenergan, pepcid and scop patches sent to patient's pharmacy -Second trimester precautions discussed -Patient advised to follow-up with OB as scheduled for prenatal care -Patient may return to MAU as needed or if her condition were to change or worsen   Rolm Bookbinder CNM 09/12/2020, 4:59 PM

## 2020-09-12 NOTE — Discharge Instructions (Signed)

## 2020-09-14 ENCOUNTER — Telehealth: Payer: Self-pay

## 2020-09-14 DIAGNOSIS — Z148 Genetic carrier of other disease: Secondary | ICD-10-CM | POA: Insufficient documentation

## 2020-09-14 LAB — CULTURE, OB URINE: Culture: NO GROWTH

## 2020-09-14 NOTE — Telephone Encounter (Signed)
Called Pt to go over [place] results of being a Carrier for Spinal Muscular Atrophy, no answer, left VM for call back.

## 2020-09-24 ENCOUNTER — Encounter: Payer: Medicaid Other | Admitting: Obstetrics and Gynecology

## 2020-10-07 ENCOUNTER — Encounter: Payer: Medicaid Other | Admitting: Obstetrics and Gynecology

## 2020-10-18 ENCOUNTER — Ambulatory Visit: Payer: Medicaid Other | Attending: Certified Nurse Midwife

## 2020-10-24 ENCOUNTER — Inpatient Hospital Stay (HOSPITAL_COMMUNITY)
Admission: AD | Admit: 2020-10-24 | Discharge: 2020-10-24 | Disposition: A | Discharge status: Home / Self Care | Payer: Medicaid Other | Attending: Obstetrics and Gynecology | Admitting: Obstetrics and Gynecology

## 2020-10-24 ENCOUNTER — Encounter (HOSPITAL_COMMUNITY): Payer: Self-pay | Admitting: Obstetrics and Gynecology

## 2020-10-24 DIAGNOSIS — Z148 Genetic carrier of other disease: Secondary | ICD-10-CM | POA: Diagnosis not present

## 2020-10-24 DIAGNOSIS — E860 Dehydration: Secondary | ICD-10-CM | POA: Insufficient documentation

## 2020-10-24 DIAGNOSIS — Z79899 Other long term (current) drug therapy: Secondary | ICD-10-CM | POA: Insufficient documentation

## 2020-10-24 DIAGNOSIS — Z3492 Encounter for supervision of normal pregnancy, unspecified, second trimester: Secondary | ICD-10-CM

## 2020-10-24 DIAGNOSIS — O210 Mild hyperemesis gravidarum: Secondary | ICD-10-CM | POA: Diagnosis present

## 2020-10-24 DIAGNOSIS — O99322 Drug use complicating pregnancy, second trimester: Secondary | ICD-10-CM | POA: Diagnosis not present

## 2020-10-24 DIAGNOSIS — Z3A19 19 weeks gestation of pregnancy: Secondary | ICD-10-CM | POA: Diagnosis not present

## 2020-10-24 DIAGNOSIS — F1290 Cannabis use, unspecified, uncomplicated: Secondary | ICD-10-CM | POA: Insufficient documentation

## 2020-10-24 DIAGNOSIS — O99282 Endocrine, nutritional and metabolic diseases complicating pregnancy, second trimester: Secondary | ICD-10-CM | POA: Diagnosis not present

## 2020-10-24 LAB — CBC
HCT: 33.9 % — ABNORMAL LOW (ref 36.0–46.0)
Hemoglobin: 11.3 g/dL — ABNORMAL LOW (ref 12.0–15.0)
MCH: 27.9 pg (ref 26.0–34.0)
MCHC: 33.3 g/dL (ref 30.0–36.0)
MCV: 83.7 fL (ref 80.0–100.0)
Platelets: 236 10*3/uL (ref 150–400)
RBC: 4.05 MIL/uL (ref 3.87–5.11)
RDW: 13.1 % (ref 11.5–15.5)
WBC: 7 10*3/uL (ref 4.0–10.5)
nRBC: 0 % (ref 0.0–0.2)

## 2020-10-24 LAB — COMPREHENSIVE METABOLIC PANEL
ALT: 11 U/L (ref 0–44)
AST: 20 U/L (ref 15–41)
Albumin: 3 g/dL — ABNORMAL LOW (ref 3.5–5.0)
Alkaline Phosphatase: 63 U/L (ref 38–126)
Anion gap: 8 (ref 5–15)
BUN: 5 mg/dL — ABNORMAL LOW (ref 6–20)
CO2: 22 mmol/L (ref 22–32)
Calcium: 8.4 mg/dL — ABNORMAL LOW (ref 8.9–10.3)
Chloride: 104 mmol/L (ref 98–111)
Creatinine, Ser: 0.46 mg/dL (ref 0.44–1.00)
GFR, Estimated: >60 mL/min (ref >60)
Glucose, Bld: 79 mg/dL (ref 70–99)
Potassium: 3.6 mmol/L (ref 3.5–5.1)
Sodium: 134 mmol/L — ABNORMAL LOW (ref 135–145)
Total Bilirubin: 0.6 mg/dL (ref 0.3–1.2)
Total Protein: 6.2 g/dL — ABNORMAL LOW (ref 6.5–8.1)

## 2020-10-24 LAB — URINALYSIS, ROUTINE W REFLEX MICROSCOPIC
Bacteria, UA: NONE SEEN
Bilirubin Urine: NEGATIVE
Glucose, UA: NEGATIVE mg/dL
Hgb urine dipstick: NEGATIVE
Ketones, ur: 80 mg/dL — AB
Leukocytes,Ua: NEGATIVE
Nitrite: NEGATIVE
Protein, ur: 30 mg/dL — AB
Specific Gravity, Urine: 1.026 (ref 1.005–1.030)
pH: 7 (ref 5.0–8.0)

## 2020-10-24 MED ORDER — PROMETHAZINE HCL 25 MG RE SUPP
25.0000 mg | Freq: Four times a day (QID) | RECTAL | Status: DC | PRN
  Administered 2020-10-24: 25 mg via RECTAL
  Filled 2020-10-24: qty 1

## 2020-10-24 MED ORDER — FAMOTIDINE 20 MG PO TABS: 20.0000 mg | ORAL_TABLET | Freq: Every day | ORAL | 1 refills | Status: DC

## 2020-10-24 MED ORDER — PROMETHAZINE HCL 25 MG RE SUPP: 25.0000 mg | Freq: Four times a day (QID) | RECTAL | 1 refills | Status: DC | PRN

## 2020-10-24 MED ORDER — LACTATED RINGERS IV BOLUS
1000.0000 mL | Freq: Once | INTRAVENOUS | Status: AC
  Administered 2020-10-24: 1000 mL via INTRAVENOUS

## 2020-10-24 MED ORDER — DOXYLAMINE-PYRIDOXINE 10-10 MG PO TBEC: 2.0000 | DELAYED_RELEASE_TABLET | Freq: Every day | ORAL | 1 refills | Status: DC

## 2020-10-24 NOTE — MAU Note (Signed)
Unable to urinate at this time.  

## 2020-10-24 NOTE — MAU Note (Addendum)
Not keeping nothing on her stomach, not even water. Feels dehydrated and weak.   Having pain in lower abd, hurts when she tries to eat.  Unable to keep medications down.  "Had the patch for a while, but it makes her itch.  Doesn't want IV meds because of her anxiety, she just wants [place] to personally give her meds."

## 2020-10-24 NOTE — MAU Provider Note (Signed)
History     CSN: [id number]  Arrival date and time: 10/24/20 1222   Event Date/Time   First Provider Initiated Contact with Patient 10/24/20 1303      Chief Complaint  Patient presents with   Emesis   Extremity Weakness   Fatigue   24 y.o. [id number] @19 .6 wks presenting with persistent N/V. Reports onset at the beginning of her pregnancy. She has been using Zofran and Phenergan but states they don't help or she can't keep them down. She has tried Scop patch but it caused a rash so she removed 2 days ago. Denies sick contacts, diarrhea, and fever. Reports intermittent LAP. Rates 9/10. No VB. Denies etoh, tobacco, or drug use.   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    Anemia 11/06/2017   Started on iron BID   Asthma    Cholestasis during pregnancy 02/25/2018   Cholestasis during pregnancy in third trimester 03/08/2018   Elevated transaminase level 02/21/2018   Headache     Past Surgical History:  Procedure Laterality Date   NO PAST SURGERIES      Family History  Problem Relation Age of Onset   Birth defects Neg Hx    Diabetes Neg Hx    Early death Neg Hx    Heart disease Neg Hx    Hypertension Neg Hx    Stroke Neg Hx    Miscarriages / Stillbirths Neg Hx     Social History   Tobacco Use   Smoking status: Never   Smokeless tobacco: Never  Vaping Use   Vaping Use: Never used  Substance Use Topics    Alcohol use: No   Drug use: Not Currently    Types: Marijuana    Comment: used cocaine 1.5 yrs ago     Allergies:  Allergies  Allergen Reactions   Scopolamine Rash    No medications prior to admission.    Review of Systems  Constitutional:  Positive for unexpected weight change. Negative for fever.  Gastrointestinal:  Positive for abdominal pain, nausea and vomiting. Negative for constipation and diarrhea.  Genitourinary:  Negative for vaginal bleeding.  Neurological:  Positive for dizziness, weakness and headaches.  Physical Exam   Blood pressure (!) 97/57, pulse 76, temperature 98.4 F (36.9 C), temperature source Oral, resp. rate 15, height 5\' 2"  (1.575 m), weight 60.4 kg, last menstrual period 07/01/2020, SpO2 100 %, not currently breastfeeding.  Physical Exam Vitals and nursing note reviewed.  Constitutional:      General: She is in acute distress (tearful).     Appearance: Normal appearance.  HENT:     Head: Normocephalic and atraumatic.  Cardiovascular:  Rate and Rhythm: Normal rate.  Pulmonary:     Effort: Pulmonary effort is normal. No respiratory distress.  Abdominal:     General: There is no distension.     Palpations: Abdomen is soft. There is no mass.     Tenderness: There is no abdominal tenderness. There is no guarding or rebound.     Hernia: No hernia is present.  Musculoskeletal:        General: Normal range of motion.     Cervical back: Normal range of motion.  Skin:    General: Skin is warm and dry.  Neurological:     General: No focal deficit present.     Mental Status: She is alert and oriented to person, place, and time.  Psychiatric:        Mood and Affect: Mood normal.        Behavior: Behavior normal.  FHT 152  Results for orders placed or performed during the hospital encounter of 10/24/20 (from the past 24 hour(s))  Urinalysis, Routine w reflex microscopic Urine, Clean Catch     Status: Abnormal   Collection Time: 10/24/20  1:38 PM   Result Value Ref Range   Color, Urine YELLOW YELLOW   APPearance HAZY (A) CLEAR   Specific Gravity, Urine 1.026 1.005 - 1.030   pH 7.0 5.0 - 8.0   Glucose, UA NEGATIVE NEGATIVE mg/dL   Hgb urine dipstick NEGATIVE NEGATIVE   Bilirubin Urine NEGATIVE NEGATIVE   Ketones, ur 80 (A) NEGATIVE mg/dL   Protein, ur 30 (A) NEGATIVE mg/dL   Nitrite NEGATIVE NEGATIVE   Leukocytes,Ua NEGATIVE NEGATIVE   RBC / HPF 0-5 0 - 5 RBC/hpf   WBC, UA 0-5 0 - 5 WBC/hpf   Bacteria, UA NONE SEEN NONE SEEN   Squamous Epithelial / LPF 6-10 0 - 5   Mucus PRESENT   CBC     Status: Abnormal   Collection Time: 10/24/20  1:38 PM  Result Value Ref Range   WBC 7.0 4.0 - 10.5 K/uL   RBC 4.05 3.87 - 5.11 MIL/uL   Hemoglobin 11.3 (L) 12.0 - 15.0 g/dL   HCT 61.9 (L) 50.9 - 32.6 %   MCV 83.7 80.0 - 100.0 fL   MCH 27.9 26.0 - 34.0 pg   MCHC 33.3 30.0 - 36.0 g/dL   RDW 71.2 45.8 - 09.9 %   Platelets 236 150 - 400 K/uL   nRBC 0.0 0.0 - 0.2 %  Comprehensive metabolic panel     Status: Abnormal   Collection Time: 10/24/20  1:38 PM  Result Value Ref Range   Sodium 134 (L) 135 - 145 mmol/L   Potassium 3.6 3.5 - 5.1 mmol/L   Chloride 104 98 - 111 mmol/L   CO2 22 22 - 32 mmol/L   Glucose, Bld 79 70 - 99 mg/dL   BUN 5 (L) 6 - 20 mg/dL   Creatinine, Ser 8.33 0.44 - 1.00 mg/dL   Calcium 8.4 (L) 8.9 - 10.3 mg/dL   Total Protein 6.2 (L) 6.5 - 8.1 g/dL   Albumin 3.0 (L) 3.5 - 5.0 g/dL   AST 20 15 - 41 U/L   ALT 11 0 - 44 U/L   Alkaline Phosphatase 63 38 - 126 U/L   Total Bilirubin 0.6 0.3 - 1.2 mg/dL   GFR, Estimated >82 >50 mL/min   Anion gap 8 5 - 15   MAU Course  Procedures LR x2L Phenergan pr  MDM Labs ordered and reviewed. Documented 9  lb weight loss since 11wks. Declines IV antiemetics d/t hx of them making her very anxious.  Feeling better after meds and IVF. Tolerating po. No further emesis. Stable for discharge home.   Assessment and Plan   1. Encounter for supervision of low-risk pregnancy in  second trimester   2. Carrier of spinal muscular atrophy   3. [redacted] weeks gestation of pregnancy   4. Morning sickness   5. Dehydration    Discharge home Follow up at [facility]- needs to schedule Rx Phenergan supp Rx Diclegis Rx Pepcid  Allergies as of 10/24/2020       Reactions   Scopolamine Rash        Medication List     STOP taking these medications    ondansetron 4 MG disintegrating tablet Commonly known as: Zofran ODT   promethazine 25 MG tablet Commonly known as: PHENERGAN Replaced by: promethazine 25 MG suppository   scopolamine 1 MG/3DAYS Commonly known as: TRANSDERM-SCOP       TAKE these medications    Doxylamine-Pyridoxine 10-10 MG Tbec Take 2 tablets by mouth at bedtime. May also take 1 tab in am and 1 tab in afternoon   famotidine 20 MG tablet Commonly known as: PEPCID Take 1 tablet (20 mg total) by mouth at bedtime.   PrePLUS 27-1 MG Tabs Take 1 tablet by mouth daily.   promethazine 25 MG suppository Commonly known as: PHENERGAN Place 1 suppository (25 mg total) rectally every 6 (six) hours as needed for nausea or vomiting. Replaces: promethazine 25 MG tablet       Donette Larry, CNM 10/24/2020, 4:41 PM

## 2022-11-02 DIAGNOSIS — Z419 Encounter for procedure for purposes other than remedying health state, unspecified: Secondary | ICD-10-CM | POA: Diagnosis not present

## 2022-11-13 ENCOUNTER — Ambulatory Visit
Admission: EM | Admit: 2022-11-13 | Discharge: 2022-11-13 | Disposition: A | Discharge status: Home / Self Care | Payer: No Typology Code available for payment source | Attending: Urgent Care | Admitting: Urgent Care

## 2022-11-13 DIAGNOSIS — U071 COVID-19: Secondary | ICD-10-CM | POA: Insufficient documentation

## 2022-11-13 DIAGNOSIS — B349 Viral infection, unspecified: Secondary | ICD-10-CM

## 2022-11-13 DIAGNOSIS — R509 Fever, unspecified: Secondary | ICD-10-CM | POA: Diagnosis present

## 2022-11-13 DIAGNOSIS — R070 Pain in throat: Secondary | ICD-10-CM

## 2022-11-13 LAB — POCT RAPID STREP A (OFFICE): Rapid Strep A Screen: NEGATIVE

## 2022-11-13 MED ORDER — PSEUDOEPHEDRINE HCL 60 MG PO TABS: 60.0000 mg | ORAL_TABLET | Freq: Three times a day (TID) | ORAL | 0 refills | Status: DC | PRN

## 2022-11-13 MED ORDER — PROMETHAZINE-DM 6.25-15 MG/5ML PO SYRP: 5.0000 mL | ORAL_SOLUTION | Freq: Three times a day (TID) | ORAL | 0 refills | Status: DC | PRN

## 2022-11-13 MED ORDER — CETIRIZINE HCL 10 MG PO TABS: 10.0000 mg | ORAL_TABLET | Freq: Every day | ORAL | 0 refills | Status: DC

## 2022-11-13 NOTE — ED Triage Notes (Signed)
Pt reports Per mother, pt has body aches, cough, fever, sore throat, headache, chills x 2-3 days.

## 2022-11-13 NOTE — ED Provider Notes (Signed)
[facility]  Note:  This document was prepared using Conservation officer, historic buildings and may include unintentional dictation errors.  MRN: [id number] DOB: January 20, 1996  Subjective:   Kathleen Cooke is a 27 y.o. female presenting for 2-3 day history of acute onset body aches, coughing, fever, throat pain, headaches, chills, malaise.  Has had multiple sick contacts in her travels.  Presents with her 5 children who are also being seen with the exact same symptoms.  No chest pain, shortness of breath or wheezing.  No smoking of any kind.  No current facility-administered medications for this encounter.  Current Outpatient Medications:    Docusate Sodium (DSS) 100 MG CAPS, Take by mouth., Disp: , Rfl:    hydrOXYzine (VISTARIL) 25 MG capsule, Take one capsule 1 hour prior to bedtime.  If not able to fall asleep within 1 hour take one more capsule.  No more then two capsules in 24 hours., Disp: , Rfl:    sertraline (ZOLOFT) 50 MG tablet, Take by mouth., Disp: , Rfl:    Doxylamine-Pyridoxine 10-10 MG TBEC, Take 2 tablets by mouth at bedtime. May also take 1 tab in am and 1 tab in afternoon, Disp: 100 tablet, Rfl: 1   famotidine (PEPCID) 20 MG tablet, Take 1 tablet (20 mg total) by mouth at bedtime., Disp: 30 tablet, Rfl: 1   Prenatal Vit-Fe Fumarate-FA (PREPLUS) 27-1 MG TABS, Take 1 tablet by mouth daily., Disp: 30 tablet, Rfl: 13   promethazine (PHENERGAN) 25 MG suppository, Place 1 suppository (25 mg total) rectally every 6 (six) hours as needed for nausea or vomiting., Disp: 12 each, Rfl: 1   Allergies  Allergen Reactions   Metoclopramide     Other Reaction(s): ANXIOUS   Scopolamine Rash    Past Medical History:  Diagnosis Date   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    Anemia 11/06/2017   Started on iron BID   Asthma    Cholestasis during pregnancy 02/25/2018   Cholestasis during pregnancy in third trimester 03/08/2018   Elevated transaminase level 02/21/2018   Headache      Past Surgical History:  Procedure Laterality Date   NO PAST SURGERIES      Family History  Problem Relation Age of Onset   Birth defects Neg Hx    Diabetes Neg Hx    Early death Neg Hx    Heart disease Neg Hx    Hypertension Neg Hx    Stroke Neg Hx    Miscarriages / Stillbirths Neg Hx     Social History   Tobacco Use   Smoking status: Never   Smokeless tobacco: Never  Vaping Use   Vaping status: Never Used  Substance Use Topics   Alcohol use: No   Drug use: Not Currently    Types: Marijuana    Comment: used cocaine 1.5 yrs ago     ROS   Objective:   Vitals: Pulse 98   Temp 98.3 F (36.8 C) (Oral)   Resp 16   LMP  (Within Days) Comment: 4 days  SpO2 96%   Breastfeeding No   Physical  Exam Constitutional:      General: She is not in acute distress.    Appearance: Normal appearance. She is well-developed. She is not ill-appearing, toxic-appearing or diaphoretic.  HENT:     Head: Normocephalic and atraumatic.     Nose: Nose normal.     Mouth/Throat:     Mouth: Mucous membranes are moist.     Pharynx: No pharyngeal swelling, oropharyngeal exudate, posterior oropharyngeal erythema or uvula swelling.     Tonsils: No tonsillar exudate or tonsillar abscesses. 0 on the right. 0 on the left.  Eyes:     General: No scleral icterus.       Right eye: No discharge.        Left eye: No discharge.     Extraocular Movements: Extraocular movements intact.  Cardiovascular:     Rate and Rhythm: Normal rate and regular rhythm.     Heart sounds: Normal heart sounds. No murmur heard.    No friction rub. No  gallop.  Pulmonary:     Effort: Pulmonary effort is normal. No respiratory distress.     Breath sounds: No stridor. No wheezing, rhonchi or rales.  Chest:     Chest wall: No tenderness.  Skin:    General: Skin is warm and dry.  Neurological:     General: No focal deficit present.     Mental Status: She is alert and oriented to person, place, and time.  Psychiatric:        Mood and Affect: Mood normal.        Behavior: Behavior normal.     Results for orders placed or performed during the hospital encounter of 11/13/22 (from the past 24 hour(s))  POCT rapid strep A     Status: None   Collection Time: 11/13/22  4:45 PM  Result Value Ref Range   Rapid Strep A Screen Negative Negative    Assessment and Plan :   PDMP not reviewed this encounter.  1. Acute viral syndrome   2. Throat pain    Deferred imaging given clear cardiopulmonary exam, hemodynamically stable vital signs. Will manage for viral illness such as viral URI, viral syndrome, viral rhinitis, COVID-19, viral pharyngitis. Recommended supportive care. Offered scripts for symptomatic relief. COVID 19 and strep culture are pending. Counseled patient on potential for adverse effects with medications prescribed/recommended today, ER and return-to-clinic precautions discussed, patient verbalized understanding.     Wallis Bamberg, [place] 11/13/22 1649

## 2022-11-13 NOTE — Discharge Instructions (Signed)
We will notify you of your test results as they arrive and may take between about 24 hours.  I encourage you to sign up for MyChart if you have not already done so as this can be the easiest way for us to communicate results to you online or through a phone app.  Generally, we only contact you if it is a positive test result.  In the meantime, if you develop worsening symptoms including fever, chest pain, shortness of breath despite our current treatment plan then please report to the emergency room as this may be a sign of worsening status from possible viral infection.  Otherwise, we will manage this as a viral syndrome. For sore throat or cough try using a honey-based tea. Use 3 teaspoons of honey with juice squeezed from half lemon. Place shaved pieces of ginger into 1/2-1 cup of water and warm over stove top. Then mix the ingredients and repeat every 4 hours as needed. Please take Tylenol 500mg-650mg every 6 hours for aches and pains, fevers. Hydrate very well with at least 2 liters of water. Eat light meals such as soups to replenish electrolytes and soft fruits, veggies. Start an antihistamine like Zyrtec (10mg daily) for postnasal drainage, sinus congestion.  You can take this together with pseudoephedrine (Sudafed) at a dose of 60 mg 2-3 times a day as needed for the same kind of congestion.  Use the cough medications as needed.   

## 2022-12-03 DIAGNOSIS — Z419 Encounter for procedure for purposes other than remedying health state, unspecified: Secondary | ICD-10-CM | POA: Diagnosis not present

## 2023-01-02 DIAGNOSIS — Z419 Encounter for procedure for purposes other than remedying health state, unspecified: Secondary | ICD-10-CM | POA: Diagnosis not present

## 2023-02-02 DIAGNOSIS — Z419 Encounter for procedure for purposes other than remedying health state, unspecified: Secondary | ICD-10-CM | POA: Diagnosis not present

## 2023-02-06 DIAGNOSIS — Z0000 Encounter for general adult medical examination without abnormal findings: Secondary | ICD-10-CM | POA: Diagnosis not present

## 2023-02-06 DIAGNOSIS — Z113 Encounter for screening for infections with a predominantly sexual mode of transmission: Secondary | ICD-10-CM | POA: Diagnosis not present

## 2023-02-06 DIAGNOSIS — Z23 Encounter for immunization: Secondary | ICD-10-CM | POA: Diagnosis not present

## 2023-02-06 DIAGNOSIS — F332 Major depressive disorder, recurrent severe without psychotic features: Secondary | ICD-10-CM | POA: Diagnosis not present

## 2023-02-06 DIAGNOSIS — J4520 Mild intermittent asthma, uncomplicated: Secondary | ICD-10-CM | POA: Diagnosis not present

## 2023-02-12 DIAGNOSIS — F332 Major depressive disorder, recurrent severe without psychotic features: Secondary | ICD-10-CM | POA: Diagnosis not present

## 2023-02-12 DIAGNOSIS — F419 Anxiety disorder, unspecified: Secondary | ICD-10-CM | POA: Diagnosis not present

## 2023-03-04 DIAGNOSIS — Z419 Encounter for procedure for purposes other than remedying health state, unspecified: Secondary | ICD-10-CM | POA: Diagnosis not present

## 2023-03-07 ENCOUNTER — Ambulatory Visit
Admission: EM | Admit: 2023-03-07 | Discharge: 2023-03-07 | Disposition: A | Discharge status: Home / Self Care | Payer: Medicaid Other | Attending: Internal Medicine | Admitting: Internal Medicine

## 2023-03-07 ENCOUNTER — Ambulatory Visit (INDEPENDENT_AMBULATORY_CARE_PROVIDER_SITE_OTHER): Payer: Medicaid Other

## 2023-03-07 DIAGNOSIS — R051 Acute cough: Secondary | ICD-10-CM

## 2023-03-07 DIAGNOSIS — J189 Pneumonia, unspecified organism: Secondary | ICD-10-CM

## 2023-03-07 DIAGNOSIS — R079 Chest pain, unspecified: Secondary | ICD-10-CM | POA: Diagnosis not present

## 2023-03-07 DIAGNOSIS — R059 Cough, unspecified: Secondary | ICD-10-CM | POA: Diagnosis not present

## 2023-03-07 MED ORDER — AMOXICILLIN-POT CLAVULANATE 875-125 MG PO TABS: 1.0000 | ORAL_TABLET | Freq: Two times a day (BID) | ORAL | 0 refills | Status: DC

## 2023-03-07 MED ORDER — AZITHROMYCIN 250 MG PO TABS: 250.0000 mg | ORAL_TABLET | Freq: Every day | ORAL | 0 refills | Status: DC

## 2023-03-07 MED ORDER — BENZONATATE 200 MG PO CAPS: 200.0000 mg | ORAL_CAPSULE | Freq: Three times a day (TID) | ORAL | 0 refills | Status: DC | PRN

## 2023-03-07 MED ORDER — ALBUTEROL SULFATE HFA 108 (90 BASE) MCG/ACT IN AERS: 1.0000 | INHALATION_SPRAY | Freq: Four times a day (QID) | RESPIRATORY_TRACT | 0 refills | Status: DC | PRN

## 2023-03-07 NOTE — ED Provider Notes (Addendum)
[facility]    CSN: [id number] Arrival date & time: 03/07/23  1345      History   Chief Complaint Chief Complaint  Patient presents with   Cough   Chest Pain    HPI Kathleen Cooke is a 27 y.o. female  presents for evaluation of URI symptoms for 7 days. Patient reports associated symptoms of cough, congestion, chest pain with coughing. Denies N/V/D, fevers, sore throat, ear pain, body aches, shortness of breath. Patient does have a hx of asthma.  Has an albuterol inhaler but has not used since symptom onset.  Patient does not have a history of smoking.  Her children have similar symptoms.  Pt has taken NyQuil OTC for symptoms.  Patient denies breast-feeding.  Pt has no other concerns at this time.    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 Authorizing Provider  albuterol (VENTOLIN HFA) 108 (90 Base) MCG/ACT inhaler Inhale 1-2 puffs into the lungs every 6 (six) hours as needed for wheezing or shortness of breath. 03/07/23  Yes Radford Pax, NP  amoxicillin-clavulanate (AUGMENTIN) 875-125 MG tablet Take 1 tablet by mouth every 12 (twelve) hours. 03/07/23  Yes Radford Pax, NP  azithromycin (ZITHROMAX) 250 MG tablet Take 1 tablet (250 mg total) by mouth daily. Take first 2 tablets together, then 1 every day until finished. 03/07/23  Yes Radford Pax, NP  benzonatate (TESSALON) 200 MG capsule Take 1 capsule (200 mg total) by mouth 3 (three) times daily as needed. 03/07/23  Yes Radford Pax, NP  cetirizine (ZYRTEC ALLERGY) 10 MG tablet Take 1 tablet (10 mg total) by mouth daily. 11/13/22   Wallis Bamberg, PA-C  Docusate Sodium (DSS) 100 MG CAPS Take by mouth. 02/20/21   [provider]  Doxylamine-Pyridoxine 10-10 MG TBEC Take 2 tablets by mouth at bedtime. May also take 1 tab in am and 1 tab in  afternoon 10/24/20   Donette Larry, CNM  famotidine (PEPCID) 20 MG tablet Take 1 tablet (20 mg total) by mouth at bedtime. 10/24/20   Donette Larry, CNM  hydrOXYzine (VISTARIL) 25 MG capsule Take one capsule 1 hour prior to bedtime.  If not able to fall asleep within 1 hour take one more capsule.  No more then two capsules in 24 hours. 12/22/21   [provider]  Prenatal Vit-Fe Fumarate-FA (PREPLUS) 27-1 MG TABS Take 1 tablet by mouth daily. 08/27/20   Bernerd Limbo, CNM  promethazine (PHENERGAN) 25 MG suppository Place 1 suppository (25 mg total) rectally every 6 (six) hours as needed for nausea or vomiting. 10/24/20   Donette Larry, CNM  promethazine-dextromethorphan (PROMETHAZINE-DM) 6.25-15 MG/5ML syrup Take 5 mLs by mouth 3 (three) times daily as needed for cough. 11/13/22   Wallis Bamberg, PA-C  pseudoephedrine  (SUDAFED) 60 MG tablet Take 1 tablet (60 mg total) by mouth every 8 (eight) hours as needed for congestion. 11/13/22   Wallis Bamberg, PA-C  sertraline (ZOLOFT) 50 MG tablet Take by mouth. 04/05/22   [provider]  ferrous sulfate 325 (65 FE) MG tablet Take 1 tablet (325 mg total) by mouth daily with breakfast. 03/09/19 07/07/19  Carrington Clamp, MD    Family History Family History  Problem Relation Age of Onset   Birth defects Neg Hx    Diabetes Neg Hx    Early death Neg Hx    Heart disease Neg Hx    Hypertension Neg Hx    Stroke Neg Hx    Miscarriages / Stillbirths Neg Hx     Social History Social History   Tobacco Use   Smoking status: Never   Smokeless tobacco: Never  Vaping Use   Vaping status: Never Used  Substance Use Topics   Alcohol use: No   Drug use: Not Currently    Types: Marijuana    Comment: used cocaine 1.5 yrs ago      Allergies   Metoclopramide and Scopolamine   Review of Systems Review of Systems  HENT:  Positive for congestion.   Respiratory:  Positive for cough.        Chest pain with coughing     Physical Exam Triage Vital Signs ED Triage Vitals  Encounter Vitals Group     BP 03/07/23 1549 113/74     Systolic BP Percentile --      Diastolic BP Percentile --      Pulse Rate 03/07/23 1549 92     Resp 03/07/23 1549 16     Temp 03/07/23 1549 98 F (36.7 C)     Temp Source 03/07/23 1549 Oral     SpO2 03/07/23 1549 97 %     Weight --      Height --      Head Circumference --      Peak Flow --      Pain Score 03/07/23 1535 5     Pain Loc --      Pain Education --      Exclude from Growth Chart --    No data found.  Updated Vital Signs BP 113/74 (BP Location: Left Arm)   Pulse 92   Temp 98 F (36.7 C) (Oral)   Resp 16   LMP 02/25/2023 (Exact Date)   SpO2 97%   Visual Acuity Right Eye Distance:   Left Eye Distance:   Bilateral Distance:    Right Eye Near:   Left Eye Near:  Bilateral Near:     Physical  Exam Vitals and nursing note reviewed.  Constitutional:      General: She is not in acute distress.    Appearance: She is well-developed. She is not ill-appearing.  HENT:     Head: Normocephalic and atraumatic.     Right Ear: Tympanic membrane and ear canal normal.     Left Ear: Tympanic membrane and ear canal normal.     Nose: Congestion present.     Mouth/Throat:     Mouth: Mucous membranes are moist.     Pharynx: Oropharynx is clear. Uvula midline. No oropharyngeal exudate or posterior oropharyngeal erythema.     Tonsils: No tonsillar exudate or tonsillar abscesses.  Eyes:     Conjunctiva/sclera: Conjunctivae normal.     Pupils: Pupils are equal, round, and reactive to light.  Cardiovascular:     Rate and Rhythm: Normal rate and regular rhythm.     Heart sounds: Normal heart sounds.  Pulmonary:     Effort: Pulmonary effort is normal. No respiratory distress.     Breath sounds: Normal breath sounds. No wheezing or rales.  Musculoskeletal:     Cervical back: Normal range of motion and neck supple.  Lymphadenopathy:     Cervical: No cervical adenopathy.  Skin:    General: Skin is warm and dry.  Neurological:     General: No focal deficit present.     Mental Status: She is alert and oriented to person, place, and time.  Psychiatric:        Mood and Affect: Mood normal.        Behavior: Behavior normal.      UC Treatments / Results  Labs (all labs ordered are listed, but only abnormal results are displayed) Labs Reviewed - No data to display  EKG   Radiology No results found.  Procedures Procedures (including critical care time)  Medications Ordered in UC Medications - No data to display  Initial Impression / Assessment and Plan / UC Course  I have reviewed the triage vital signs and the nursing notes.  Pertinent labs & imaging results that were available during my care of the patient were reviewed by me and considered in my medical decision making (see chart  for details).     Reviewed exam and symptoms with patient.  She declined nebulizer and states she does not feel she needs it.  No wheezing on exam.  Wet read of x-ray suspicious for left-sided pneumonia.  Will start Zithromax and Augmentin.  Refilled albuterol inhaler.  Advised PCP follow-up 2 to 3 days for recheck as well as in 4 weeks for repeat chest x-ray.  Strict ER precautions reviewed and she verbalized understanding.  Addendum 1736: Chest x-ray positive for pneumonia.  Patient treated at time of exam with antibiotics.  MyChart message sent to patient regarding results. Final Clinical Impressions(s) / UC Diagnoses   Final diagnoses:  Acute cough  Pneumonia of left lung due to infectious organism, unspecified part of lung     Discharge Instructions      Start Zithromax and Augmentin as prescribed.  You may take Tessalon as needed for cough.  I have refilled your albuterol inhaler as needed for shortness of breath or wheezing.  Lots of rest and fluids.  Please follow-up with your PCP for repeat chest x-ray in 4 weeks to confirm resolution of the infection.  Please follow-up with your PCP in 2 to 3 days for recheck.  Please go to the  ER for any worsening symptoms.  I hope you feel better soon!     ED Prescriptions     Medication Sig Dispense Auth. Provider   azithromycin (ZITHROMAX) 250 MG tablet Take 1 tablet (250 mg total) by mouth daily. Take first 2 tablets together, then 1 every day until finished. 6 tablet Radford Pax, NP   amoxicillin-clavulanate (AUGMENTIN) 875-125 MG tablet Take 1 tablet by mouth every 12 (twelve) hours. 14 tablet Radford Pax, NP   benzonatate (TESSALON) 200 MG capsule Take 1 capsule (200 mg total) by mouth 3 (three) times daily as needed. 20 capsule Radford Pax, NP   albuterol (VENTOLIN HFA) 108 (90 Base) MCG/ACT inhaler Inhale 1-2 puffs into the lungs every 6 (six) hours as needed for wheezing or shortness of breath. 1 each Radford Pax, NP       PDMP not reviewed this encounter.   Radford Pax, NP 03/07/23 1656    Radford Pax, NP 03/07/23 1656    Radford Pax, NP 03/07/23 [phone]

## 2023-03-07 NOTE — Discharge Instructions (Signed)
Start Zithromax and Augmentin as prescribed.  You may take Tessalon as needed for cough.  I have refilled your albuterol inhaler as needed for shortness of breath or wheezing.  Lots of rest and fluids.  Please follow-up with your PCP for repeat chest x-ray in 4 weeks to confirm resolution of the infection.  Please follow-up with your PCP in 2 to 3 days for recheck.  Please go to the ER for any worsening symptoms.  I hope you feel better soon!

## 2023-03-07 NOTE — ED Triage Notes (Signed)
Pt presents with a cough and chest pain when coughing. States her body has been aching.   Home interventions: Nyquil

## 2023-04-04 DIAGNOSIS — Z419 Encounter for procedure for purposes other than remedying health state, unspecified: Secondary | ICD-10-CM | POA: Diagnosis not present

## 2023-04-16 ENCOUNTER — Ambulatory Visit
Admission: RE | Admit: 2023-04-16 | Discharge: 2023-04-16 | Disposition: A | Discharge status: Home / Self Care | Payer: Medicaid Other | Source: Ambulatory Visit | Attending: Family Medicine | Admitting: Family Medicine

## 2023-04-16 VITALS — BP 129/81 | HR 80 | Temp 98.5°F | Resp 17

## 2023-04-16 DIAGNOSIS — Z1152 Encounter for screening for COVID-19: Secondary | ICD-10-CM

## 2023-04-16 DIAGNOSIS — J069 Acute upper respiratory infection, unspecified: Secondary | ICD-10-CM | POA: Diagnosis not present

## 2023-04-16 LAB — POCT INFLUENZA A/B
Influenza A, POC: NEGATIVE
Influenza B, POC: NEGATIVE

## 2023-04-16 MED ORDER — BENZONATATE 200 MG PO CAPS: 200.0000 mg | ORAL_CAPSULE | Freq: Three times a day (TID) | ORAL | 0 refills | Status: DC | PRN

## 2023-04-16 NOTE — ED Triage Notes (Signed)
 Pt presents with congestion, body aches, loss of appetite for 1 week

## 2023-04-16 NOTE — ED Provider Notes (Signed)
 [facility]    CSN: [id number] Arrival date & time: 04/16/23  1017      History   Chief Complaint Chief Complaint  Patient presents with   Sore Throat    body ache coughing headaches - Entered by patient    HPI Kathleen Cooke is a 28 y.o. female.    Sore Throat  She is having bad body aches, that started 3 days ago.  No known fevers.  Headache, sinus congestion, drainage.  She is losing her voice, coughing.  Worse symptoms are headache and body aches.  She is not taking any otc medications at this time.  She is here today with her 5 kids, who are also sick with the same symptoms, they started with sxs 1 week ago.  No n/v.        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 Authorizing Provider  albuterol  (VENTOLIN  HFA) 108 (90 Base) MCG/ACT inhaler Inhale 1-2 puffs into the lungs every 6 (six) hours as needed for wheezing or shortness of breath. 03/07/23   Mayer, Jodi R, NP  amoxicillin -clavulanate (AUGMENTIN ) 875-125 MG tablet Take 1 tablet by mouth every 12 (twelve) hours. Patient not taking: Reported on 04/16/2023 03/07/23   Loreda Myla SAUNDERS, NP  azithromycin  (ZITHROMAX ) 250 MG tablet Take 1 tablet (250 mg total) by mouth daily. Take first 2 tablets together, then 1 every day until finished. Patient not taking: Reported on 04/16/2023 03/07/23   Mayer, Jodi R, NP  benzonatate  (TESSALON ) 200 MG capsule Take 1 capsule (200 mg total) by mouth 3 (three) times daily as needed. Patient not taking: Reported on 04/16/2023 03/07/23   Loreda Myla SAUNDERS, NP  cetirizine  (ZYRTEC  ALLERGY) 10 MG tablet Take 1 tablet (10 mg total) by mouth daily. 11/13/22   Christopher Savannah, PA-C  Docusate Sodium  (DSS) 100 MG CAPS Take by mouth. 02/20/21   [provider]  Doxylamine -Pyridoxine  10-10 MG TBEC Take 2 tablets by mouth at bedtime.  May also take 1 tab in am and 1 tab in afternoon 10/24/20   Sung Hollering, CNM  famotidine  (PEPCID ) 20 MG tablet Take 1 tablet (20 mg total) by mouth at bedtime. 10/24/20   Sung Hollering, CNM  hydrOXYzine  (VISTARIL ) 25 MG capsule Take one capsule 1 hour prior to bedtime.  If not able to fall asleep within 1 hour take one more capsule.  No more then two capsules in 24 hours. Patient not taking: Reported on 04/16/2023 12/22/21   [provider]  Prenatal Vit-Fe Fumarate-FA (PREPLUS) 27-1 MG TABS Take 1 tablet by mouth daily. 08/27/20   Walker, Jamilla R, CNM  promethazine  (PHENERGAN ) 25 MG suppository Place 1 suppository (25 mg total) rectally every 6 (six) hours as needed for nausea or vomiting. Patient not taking: Reported on 04/16/2023 10/24/20   Sung Hollering, CNM  promethazine -dextromethorphan (PROMETHAZINE -DM) 6.25-15 MG/5ML syrup Take 5 mLs by  mouth 3 (three) times daily as needed for cough. 11/13/22   Christopher Savannah, PA-C  pseudoephedrine  (SUDAFED) 60 MG tablet Take 1 tablet (60 mg total) by mouth every 8 (eight) hours as needed for congestion. Patient not taking: Reported on 04/16/2023 11/13/22   Christopher Savannah, PA-C  sertraline (ZOLOFT) 50 MG tablet Take by mouth. Patient not taking: Reported on 04/16/2023 04/05/22   [provider]  ferrous sulfate  325 (65 FE) MG tablet Take 1 tablet (325 mg total) by mouth daily with breakfast. 03/09/19 07/07/19  Sarrah Browning, MD    Family History Family History  Problem Relation Age of Onset   Birth defects Neg Hx    Diabetes Neg Hx    Early death Neg Hx    Heart disease Neg Hx    Hypertension Neg Hx    Stroke Neg Hx    Miscarriages / Stillbirths Neg Hx     Social History Social History   Tobacco Use   Smoking status: Never   Smokeless tobacco: Never  Vaping Use   Vaping status: Never Used  Substance Use Topics   Alcohol use: No   Drug use: Not Currently    Types: Marijuana    Comment: used cocaine 1.5 yrs ago      Allergies   Metoclopramide  and Scopolamine    Review of Systems Review of Systems  Constitutional:  Positive for fatigue. Negative for chills and fever.  HENT:  Positive for congestion, rhinorrhea and sore throat.   Respiratory:  Positive for cough.   Cardiovascular: Negative.   Gastrointestinal: Negative.   Genitourinary: Negative.   Musculoskeletal:  Positive for myalgias.  Psychiatric/Behavioral: Negative.       Physical Exam Triage Vital Signs ED Triage Vitals  Encounter Vitals Group     BP 04/16/23 1041 129/81     Systolic BP Percentile --      Diastolic BP Percentile --      Pulse Rate 04/16/23 1041 80     Resp 04/16/23 1041 17     Temp 04/16/23 1041 98.5 F (36.9 C)     Temp Source 04/16/23 1041 Oral     SpO2 04/16/23 1041 97 %     Weight --      Height --      Head Circumference --      Peak Flow --      Pain Score 04/16/23 1054 5      Pain Loc --      Pain Education --      Exclude from Growth Chart --    No data found.  Updated Vital Signs BP 129/81 (BP Location: Right Arm)   Pulse 80   Temp 98.5 F (36.9 C) (Oral)   Resp 17   LMP 04/14/2023 (Exact Date)   SpO2 97%   Visual Acuity Right Eye Distance:   Left Eye Distance:   Bilateral Distance:    Right Eye Near:   Left Eye Near:    Bilateral Near:     Physical Exam Constitutional:      General: She is not in acute distress.    Appearance: Normal appearance. She is not ill-appearing or toxic-appearing.  HENT:     Head: Normocephalic.     Nose: Congestion present. No rhinorrhea.     Mouth/Throat:     Mouth: Mucous membranes are moist.     Pharynx: No oropharyngeal exudate or posterior oropharyngeal erythema.  Cardiovascular:     Rate and Rhythm: Normal rate and regular rhythm.  Pulmonary:     Effort: Pulmonary effort is normal.     Breath sounds: Normal breath sounds.  Musculoskeletal:     Cervical back: Normal range of motion and neck supple. No tenderness.  Lymphadenopathy:     Cervical: No cervical adenopathy.  Skin:    General: Skin is warm.  Neurological:     General: No focal deficit present.     Mental Status: She is alert.  Psychiatric:        Mood and Affect: Mood normal.      UC Treatments / Results  Labs (all labs ordered are listed, but only abnormal results are displayed) Labs Reviewed  SARS CORONAVIRUS 2 BY RT PCR  POCT INFLUENZA A/B    EKG   Radiology No results found.  Procedures Procedures (including critical care time)  Medications Ordered in UC Medications - No data to display  Initial Impression / Assessment and Plan / UC Course  I have reviewed the triage vital signs and the nursing notes.  Pertinent labs & imaging results that were available during my care of the patient were reviewed by me and considered in my medical decision making (see chart for details).   Final Clinical Impressions(s) /  UC Diagnoses   Final diagnoses:  Acute upper respiratory infection  Encounter for screening for COVID-19     Discharge Instructions      You were seen today for upper respiratory symptoms.  Your flu swab was negative.  Your covid swab will be resulted tomorrow and you will be notified if positive.  At this time your symptoms appear viral.  I recommend rest, and fluids.  I have sent out a medication to help with cough.  I recommend tylenol  for body aches and headache.  Please return if you are not improving or worsening.     ED Prescriptions     Medication Sig Dispense Auth. Provider   benzonatate  (TESSALON ) 200 MG capsule Take 1 capsule (200 mg total) by mouth 3 (three) times daily as needed for cough. 21 capsule Darral Longs, MD      PDMP not reviewed this encounter.   Darral Longs, MD 04/16/23 [id number]

## 2023-04-16 NOTE — Discharge Instructions (Addendum)
 You were seen today for upper respiratory symptoms.  Your flu swab was negative.  Your covid swab will be resulted tomorrow and you will be notified if positive.  At this time your symptoms appear viral.  I recommend rest, and fluids.  I have sent out a medication to help with cough.  I recommend tylenol  for body aches and headache.  Please return if you are not improving or worsening.

## 2023-04-17 LAB — SARS CORONAVIRUS 2 BY RT PCR: SARS Coronavirus 2 by RT PCR: NEGATIVE

## 2023-06-05 ENCOUNTER — Other Ambulatory Visit: Payer: Self-pay

## 2023-06-05 ENCOUNTER — Emergency Department (HOSPITAL_COMMUNITY)
Admission: EM | Admit: 2023-06-05 | Discharge: 2023-06-06 | Disposition: A | Discharge status: Home / Self Care | Attending: Emergency Medicine | Admitting: Emergency Medicine

## 2023-06-05 ENCOUNTER — Ambulatory Visit: Payer: Self-pay

## 2023-06-05 DIAGNOSIS — J101 Influenza due to other identified influenza virus with other respiratory manifestations: Secondary | ICD-10-CM | POA: Diagnosis not present

## 2023-06-05 DIAGNOSIS — R059 Cough, unspecified: Secondary | ICD-10-CM | POA: Diagnosis present

## 2023-06-05 LAB — RESP PANEL BY RT-PCR (RSV, FLU A&B, COVID)  RVPGX2
Influenza A by PCR: POSITIVE — AB
Influenza B by PCR: NEGATIVE
Resp Syncytial Virus by PCR: NEGATIVE
SARS Coronavirus 2 by RT PCR: NEGATIVE

## 2023-06-05 MED ORDER — IBUPROFEN 400 MG PO TABS
600.0000 mg | ORAL_TABLET | Freq: Once | ORAL | Status: AC
  Administered 2023-06-05: 600 mg via ORAL
  Filled 2023-06-05: qty 1

## 2023-06-05 MED ORDER — ONDANSETRON 4 MG PO TBDP
4.0000 mg | ORAL_TABLET | Freq: Once | ORAL | Status: AC
  Administered 2023-06-05: 4 mg via ORAL
  Filled 2023-06-05: qty 1

## 2023-06-05 NOTE — ED Triage Notes (Signed)
 Pt states that she has cough, congestion, headache, and body aches with nausea x 1 day.

## 2023-06-06 ENCOUNTER — Ambulatory Visit

## 2023-06-06 MED ORDER — ONDANSETRON 4 MG PO TBDP: 4.0000 mg | ORAL_TABLET | Freq: Three times a day (TID) | ORAL | 0 refills | Status: DC | PRN

## 2023-06-06 NOTE — ED Provider Notes (Signed)
 [place] [facility] AT [facility] Provider Note   CSN: [id number] Arrival date & time: 06/05/23  2147     History  Chief Complaint  Patient presents with   Cough    Kathleen Cooke is a 28 y.o. female with no significant past medical history presents with concern for dry cough, congestion, headache, and bodyaches ongoing for past day.  Also reports some nausea, but no vomiting.  Denies any abdominal pain.  Reports her ears feel "stuffy".  has been able to keep food down at home.   Cough      Home Medications Prior to Admission medications   Medication Sig Start Date End Date Taking? Authorizing Provider  ondansetron (ZOFRAN-ODT) 4 MG disintegrating tablet Take 1 tablet (4 mg total) by mouth every 8 (eight) hours as needed for nausea or vomiting. 06/06/23  Yes Arabella Merles, PA-C  albuterol (VENTOLIN HFA) 108 (90 Base) MCG/ACT inhaler Inhale 1-2 puffs into the lungs every 6 (six) hours as needed for wheezing or shortness of breath. 03/07/23   Radford Pax, NP  benzonatate (TESSALON) 200 MG capsule Take 1 capsule (200 mg total) by mouth 3 (three) times daily as needed for cough. 04/16/23   Piontek, Denny Peon, MD  cetirizine (ZYRTEC ALLERGY) 10 MG tablet Take 1 tablet (10 mg total) by mouth daily. 11/13/22   Wallis Bamberg, PA-C  Docusate Sodium (DSS) 100 MG CAPS Take by mouth. 02/20/21   [provider]  Doxylamine-Pyridoxine 10-10 MG TBEC Take 2 tablets by mouth at bedtime. May also take 1 tab in am and 1 tab in afternoon 10/24/20   Donette Larry, CNM  famotidine (PEPCID) 20 MG tablet Take 1 tablet (20 mg total) by mouth at bedtime. 10/24/20   Donette Larry, CNM  hydrOXYzine (VISTARIL) 25 MG capsule Take one capsule 1 hour prior to bedtime.  If not able to fall asleep within 1 hour take one more capsule.  No more then two capsules in 24 hours. Patient not taking: Reported on 04/16/2023 12/22/21   [provider]  Prenatal Vit-Fe Fumarate-FA  (PREPLUS) 27-1 MG TABS Take 1 tablet by mouth daily. 08/27/20   Bernerd Limbo, CNM  promethazine (PHENERGAN) 25 MG suppository Place 1 suppository (25 mg total) rectally every 6 (six) hours as needed for nausea or vomiting. Patient not taking: Reported on 04/16/2023 10/24/20   Donette Larry, CNM  promethazine-dextromethorphan (PROMETHAZINE-DM) 6.25-15 MG/5ML syrup Take 5 mLs by mouth 3 (three) times daily as needed for cough. 11/13/22   Wallis Bamberg, PA-C  pseudoephedrine (SUDAFED) 60 MG tablet Take 1 tablet (60 mg total) by mouth every 8 (eight) hours as needed for congestion. Patient not taking: Reported on 04/16/2023 11/13/22   Wallis Bamberg, PA-C  sertraline (ZOLOFT) 50 MG tablet Take by mouth. Patient not taking: Reported on 04/16/2023 04/05/22   [provider]  ferrous sulfate 325 (65 FE) MG tablet Take 1 tablet (325 mg total) by mouth daily with breakfast. 03/09/19 07/07/19  Carrington Clamp, MD      Allergies    Metoclopramide and Scopolamine    Review of Systems   Review of Systems  Respiratory:  Positive for cough.     Physical Exam Updated Vital Signs BP 114/84 (BP Location: Left Arm)   Pulse (!) 112   Temp 98.7 F (37.1 C) (Oral)   Resp 20   Ht 5\' 2"  (1.575 m)   Wt 65.8 kg   LMP 06/04/2023 (Exact Date)   SpO2 99%   BMI  26.52 kg/m  Physical Exam Vitals and nursing note reviewed.  Constitutional:      General: She is not in acute distress.    Appearance: She is well-developed.  HENT:     Head: Normocephalic and atraumatic.     Right Ear: Tympanic membrane, ear canal and external ear normal.     Left Ear: Tympanic membrane, ear canal and external ear normal.     Mouth/Throat:     Mouth: Mucous membranes are moist.     Pharynx: No oropharyngeal exudate or posterior oropharyngeal erythema.  Eyes:     Conjunctiva/sclera: Conjunctivae normal.  Cardiovascular:     Rate and Rhythm: Normal rate and regular rhythm.     Heart sounds: No murmur heard. Pulmonary:      Effort: Pulmonary effort is normal. No respiratory distress.     Breath sounds: Normal breath sounds.  Abdominal:     Palpations: Abdomen is soft.     Tenderness: There is no abdominal tenderness.  Musculoskeletal:        General: No swelling.     Cervical back: Neck supple.  Skin:    General: Skin is warm and dry.     Capillary Refill: Capillary refill takes less than 2 seconds.  Neurological:     Mental Status: She is alert.  Psychiatric:        Mood and Affect: Mood normal.     ED Results / Procedures / Treatments   Labs (all labs ordered are listed, but only abnormal results are displayed) Labs Reviewed  RESP PANEL BY RT-PCR (RSV, FLU A&B, COVID)  RVPGX2 - Abnormal; Notable for the following components:      Result Value   Influenza A by PCR POSITIVE (*)    All other components within normal limits    EKG None  Radiology No results found.  Procedures Procedures    Medications Ordered in ED Medications  ondansetron (ZOFRAN-ODT) disintegrating tablet 4 mg (4 mg Oral Given 06/05/23 2213)  ibuprofen (ADVIL) tablet 600 mg (600 mg Oral Given 06/05/23 2213)    ED Course/ Medical Decision Making/ A&P                                 Medical Decision Making    Differential diagnosis includes but is not limited to COVID, flu, RSV, viral URI, strep pharyngitis, viral pharyngitis, allergic rhinitis, pneumonia, bronchitis   ED Course:  Patient well-appearing, stable vital signs aside from a slight tachycardia to 112 upon arrival and febrile to 100.4.  She was given Zofran and ibuprofen by triage for fever and nausea.  She tested positive for influenza A would explain her vital sign abnormalities.  Negative for flu B, RSV, and COVID.  She reports a mild dry cough that started yesterday, but lungs clear to auscultation, and given symptoms just started, low concern for pneumonia at this time.  Reports ears are stuffy, but no signs of otitis media or otitis externa on exam.   Low concern for any other emergent pathology at this time.  She states the Zofran did help the nausea she was experiencing earlier.  Fever did improve to 98.7 after ibuprofen.  She is tolerating p.o. intake.  Stable and appropriate for discharge home I did have a shared decision-making conversation with the patient regarding Tamiflu use.  She declines this medication at this time and would like to try over-the-counter medications for her symptoms.  Impression: Influenza A  Disposition:  The patient was discharged home with instructions to use over-the-counter medications as needed for symptom control.  May take prescribed Zofran as needed for nausea. Return precautions given.              Final Clinical Impression(s) / ED Diagnoses Final diagnoses:  Influenza A    Rx / DC Orders ED Discharge Orders          Ordered    ondansetron (ZOFRAN-ODT) 4 MG disintegrating tablet  Every 8 hours PRN        06/06/23 0044              Arabella Merles, PA-C 06/06/23 0044    Anders Simmonds T, DO 06/06/23 [phone]

## 2023-06-06 NOTE — Discharge Instructions (Signed)
 You tested positive for the flu today.  Your COVID, RSV are negative.  Medications prescribed:   You have been prescribed Zofran which is the nausea medication you are given here today.  You may take 1 tablet up to every 8 hours as needed for nausea and vomiting.  Home care instructions:  You may take up to 1000mg  of tylenol every 6 hours as needed for pain. Do not take more then 4g per day.   You may use up to 600mg  ibuprofen every 6 hours as needed for pain.  Do not exceed 2.4g of ibuprofen per day.  You were given your first dose of ibuprofen here today.    For cough: honey 1/2 to 1 teaspoon (you can dilute the honey in water or another fluid).  You can also use guaifenesin and dextromethorphan for cough which are over-the-counter medications. You can use a humidifier for chest congestion and cough.  If you don't have a humidifier, you can sit in the bathroom with the hot shower running.      For sore throat: try warm salt water gargles, cepacol lozenges, throat spray, warm tea or water with lemon/honey, popsicles or ice, or OTC cold relief medicine for throat discomfort.    For congestion: Flonase 1-2 sprays in each nostril daily.    It is important to stay hydrated: drink plenty of fluids (water, gatorade/powerade/pedialyte, juices, or teas) to keep your throat moisturized and help further relieve irritation/discomfort.   Your illness is contagious and can be spread to others, especially during the first 3 or 4 days. It cannot be cured by antibiotics or other medicines. Take basic precautions such as wearing a mask, washing your hands often, covering your mouth when you cough or sneeze, and avoiding public places where you could spread your illness to others.   You may return to normal activities (work/school) when:  - You are having improvement in symptoms  - AND have had resolution of fever without the use of fever-reducing medications for 24 hours  Follow-up instructions: Please  follow-up with your primary care provider for further evaluation of your symptoms if you are not feeling better within the next 5 days.  Return instructions:  Please return to the Emergency Department if you experience worsening symptoms.  RETURN IMMEDIATELY IF you develop shortness of breath, confusion or altered mental status, a new rash, become dizzy, faint, or poorly responsive, or are unable to be cared for at home. Please return if you have persistent vomiting and cannot keep down fluids or develop a fever that is not controlled by tylenol or motrin.   Please return if you have any other emergent concerns.

## 2023-06-28 ENCOUNTER — Ambulatory Visit
Admission: RE | Admit: 2023-06-28 | Discharge: 2023-06-28 | Disposition: A | Discharge status: Home / Self Care | Source: Ambulatory Visit | Attending: Family Medicine | Admitting: Family Medicine

## 2023-06-28 VITALS — BP 109/72 | HR 78 | Temp 98.2°F | Resp 16

## 2023-06-28 DIAGNOSIS — Z113 Encounter for screening for infections with a predominantly sexual mode of transmission: Secondary | ICD-10-CM | POA: Diagnosis present

## 2023-06-28 DIAGNOSIS — N760 Acute vaginitis: Secondary | ICD-10-CM | POA: Diagnosis present

## 2023-06-28 LAB — POCT URINE PREGNANCY: Preg Test, Ur: NEGATIVE

## 2023-06-28 NOTE — ED Provider Notes (Signed)
 [facility]    CSN: [id number] Arrival date & time: 06/28/23  4098      History   Chief Complaint Chief Complaint  Patient presents with   SEXUALLY TRANSMITTED DISEASE    Iwant to do a std test - Entered by patient    HPI [facility] is a 28 y.o. female.   HPI Vaginal discharge scribed as yellow x 1 week associated with itching.  Would like to have STI testing admits new partner.  Denies genital rash, dysuria, frequency, urgency, hematuria, menstrual irregularity.  LMP 3 weeks ago.  Like HIV and syphilis testing as well.  Denies abdominal pain, fever, chills, sweats. 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    Anemia 11/06/2017   Started on iron BID   Asthma    Cholestasis during pregnancy 02/25/2018   Cholestasis during pregnancy in third trimester 03/08/2018   Elevated transaminase level 02/21/2018   Headache     Patient Active Problem List   Diagnosis Date Noted   Carrier of spinal muscular atrophy 09/14/2020   Supervision of low-risk pregnancy 08/26/2020   Pain of round ligament affecting pregnancy, antepartum 08/09/2020   Asthma 01/16/2018    Past Surgical History:  Procedure Laterality Date   NO PAST SURGERIES      OB History     Gravida  4   Para  3   Term  3   Preterm  0   AB  0   Living  3      SAB  0   IAB  0   Ectopic  0   Multiple  0   Live Births  3            Home Medications    Prior to Admission medications   Medication Sig Start Date End Date Taking? Authorizing Provider  albuterol (VENTOLIN HFA) 108 (90 Base) MCG/ACT  inhaler Inhale 1-2 puffs into the lungs every 6 (six) hours as needed for wheezing or shortness of breath. 03/07/23   Radford Pax, NP  benzonatate (TESSALON) 200 MG capsule Take 1 capsule (200 mg total) by mouth 3 (three) times daily as needed for cough. 04/16/23   Piontek, Denny Peon, MD  cetirizine (ZYRTEC ALLERGY) 10 MG tablet Take 1 tablet (10 mg total) by mouth daily. 11/13/22   Wallis Bamberg, PA-C  Docusate Sodium (DSS) 100 MG CAPS Take by mouth. 02/20/21   [provider]  Doxylamine-Pyridoxine 10-10 MG TBEC Take 2 tablets by mouth at bedtime. May also take 1 tab in am and 1 tab in afternoon 10/24/20   Donette Larry, CNM  famotidine (PEPCID) 20 MG tablet Take 1 tablet (20 mg total) by mouth at bedtime. 10/24/20   Donette Larry, CNM  hydrOXYzine (VISTARIL) 25 MG capsule Take one capsule 1 hour prior to bedtime.  If not able to fall asleep within 1 hour take one more capsule.  No more then two capsules in 24 hours. Patient not taking: Reported on 04/16/2023 12/22/21   [provider]  ondansetron (ZOFRAN-ODT) 4 MG disintegrating tablet Take 1 tablet (4 mg  total) by mouth every 8 (eight) hours as needed for nausea or vomiting. 06/06/23   Arabella Merles, PA-C  Prenatal Vit-Fe Fumarate-FA (PREPLUS) 27-1 MG TABS Take 1 tablet by mouth daily. 08/27/20   Bernerd Limbo, CNM  promethazine (PHENERGAN) 25 MG suppository Place 1 suppository (25 mg total) rectally every 6 (six) hours as needed for nausea or vomiting. Patient not taking: Reported on 04/16/2023 10/24/20   Donette Larry, CNM  promethazine-dextromethorphan (PROMETHAZINE-DM) 6.25-15 MG/5ML syrup Take 5 mLs by mouth 3 (three) times daily as needed for cough. 11/13/22   Wallis Bamberg, PA-C  pseudoephedrine (SUDAFED) 60 MG tablet Take 1 tablet (60 mg total) by mouth every 8 (eight) hours as needed for congestion. Patient not taking: Reported on 04/16/2023 11/13/22   Wallis Bamberg, PA-C  sertraline (ZOLOFT) 50 MG tablet Take by  mouth. Patient not taking: Reported on 04/16/2023 04/05/22   [provider]  ferrous sulfate 325 (65 FE) MG tablet Take 1 tablet (325 mg total) by mouth daily with breakfast. 03/09/19 07/07/19  Carrington Clamp, MD    Family History Family History  Problem Relation Age of Onset   Birth defects Neg Hx    Diabetes Neg Hx    Early death Neg Hx    Heart disease Neg Hx    Hypertension Neg Hx    Stroke Neg Hx    Miscarriages / Stillbirths Neg Hx     Social History Social History   Tobacco Use   Smoking status: Never   Smokeless tobacco: Never  Vaping Use   Vaping status: Never Used  Substance Use Topics   Alcohol use: No   Drug use: Not Currently    Types: Marijuana    Comment: used cocaine 1.5 yrs ago      Allergies   Metoclopramide and Scopolamine   Review of Systems Review of Systems   Physical Exam Triage Vital Signs ED Triage Vitals [06/28/23 0954]  Encounter Vitals Group     BP 109/72     Systolic BP Percentile      Diastolic BP Percentile      Pulse Rate 78     Resp 16     Temp 98.2 F (36.8 C)     Temp Source Oral     SpO2 97 %     Weight      Height      Head Circumference      Peak Flow      Pain Score 0     Pain Loc      Pain Education      Exclude from Growth Chart    No data found.  Updated Vital Signs BP 109/72 (BP Location: Right Arm)   Pulse 78   Temp 98.2 F (36.8 C) (Oral)   Resp 16   LMP  (LMP Unknown)   SpO2 97%   Visual Acuity Right Eye Distance:   Left Eye Distance:   Bilateral Distance:    Right Eye Near:   Left Eye Near:    Bilateral Near:     Physical Exam Vitals and nursing note reviewed.  Constitutional:      Appearance: She is not ill-appearing.  Cardiovascular:     Rate and Rhythm: Normal rate and regular rhythm.     Heart sounds: Normal heart sounds.  Pulmonary:     Effort: Pulmonary effort is normal.     Breath sounds: Normal breath sounds.  Abdominal:     General: Bowel sounds are normal.  Palpations: Abdomen is soft.     Tenderness: There is no abdominal tenderness. There is no guarding.  Skin:    General: Skin is warm.  Neurological:     Mental Status: She is alert and oriented to person, place, and time.  Psychiatric:        Mood and Affect: Mood normal.      UC Treatments / Results  Labs (all labs ordered are listed, but only abnormal results are displayed) Labs Reviewed  RPR  HIV ANTIBODY (ROUTINE TESTING W REFLEX)  POCT URINE PREGNANCY  CERVICOVAGINAL ANCILLARY ONLY    EKG   Radiology No results found.  Procedures Procedures (including critical care time)  Medications Ordered in UC Medications - No data to display  Initial Impression / Assessment and Plan / UC Course  I have reviewed the triage vital signs and the nursing notes.  Pertinent labs & imaging results that were available during my care of the patient were reviewed by me and considered in my medical decision making (see chart for details).    28 year old female with vaginal discharge requesting STI testing. Abdominal exam is benign, pregnancy test is negative. Will withhold treatment pending results of cytology swab  Final Clinical Impressions(s) / UC Diagnoses   Final diagnoses:  Screen for STD (sexually transmitted disease)   Discharge Instructions   None    ED Prescriptions   None    PDMP not reviewed this encounter.   Meliton Rattan, [place] 06/28/23 1025

## 2023-06-28 NOTE — Discharge Instructions (Signed)
 Test results will be released to your MyChart account We will contact you if anything is positive and requires treatment.

## 2023-06-28 NOTE — ED Triage Notes (Signed)
 Pt states vaginal itching with yellow discharge for the past week.

## 2023-06-29 ENCOUNTER — Telehealth: Payer: Self-pay

## 2023-06-29 LAB — CERVICOVAGINAL ANCILLARY ONLY
Bacterial Vaginitis (gardnerella): POSITIVE — AB
Candida Glabrata: NEGATIVE
Candida Vaginitis: NEGATIVE
Chlamydia: NEGATIVE
Comment: NEGATIVE
Comment: NEGATIVE
Comment: NEGATIVE
Comment: NEGATIVE
Comment: NEGATIVE
Comment: NORMAL
Neisseria Gonorrhea: NEGATIVE
Trichomonas: NEGATIVE

## 2023-06-29 LAB — RPR: RPR Ser Ql: NONREACTIVE

## 2023-06-29 LAB — HIV ANTIBODY (ROUTINE TESTING W REFLEX): HIV Screen 4th Generation wRfx: NONREACTIVE

## 2023-06-29 MED ORDER — METRONIDAZOLE 500 MG PO TABS
500.0000 mg | ORAL_TABLET | Freq: Two times a day (BID) | ORAL | 0 refills | Status: AC
Start: 2023-06-29 — End: 2023-07-06

## 2023-06-29 NOTE — Telephone Encounter (Signed)
 Per protocol, pt requires tx with metronidazole. Rx sent to pharmacy on file.

## 2023-07-12 ENCOUNTER — Other Ambulatory Visit: Payer: Self-pay

## 2023-07-12 ENCOUNTER — Ambulatory Visit
Admission: RE | Admit: 2023-07-12 | Discharge: 2023-07-12 | Disposition: A | Discharge status: Home / Self Care | Source: Ambulatory Visit | Attending: Physician Assistant | Admitting: Physician Assistant

## 2023-07-12 VITALS — BP 110/74 | HR 62 | Temp 98.4°F | Resp 16 | Ht 62.0 in | Wt 140.0 lb

## 2023-07-12 DIAGNOSIS — R300 Dysuria: Secondary | ICD-10-CM | POA: Insufficient documentation

## 2023-07-12 DIAGNOSIS — L292 Pruritus vulvae: Secondary | ICD-10-CM

## 2023-07-12 DIAGNOSIS — N898 Other specified noninflammatory disorders of vagina: Secondary | ICD-10-CM

## 2023-07-12 NOTE — ED Triage Notes (Signed)
 Pt presents with complaints of vaginal itching with yellow discharge. Pt states she was seen here on 3/27, tested BV+. Pt picked up a 7-day supply of Flagyl at pharmacy, still has three pills left. Pt currently denies pain. Pt states she is not sexually active at the moment.

## 2023-07-12 NOTE — Discharge Instructions (Signed)
 You were seen today for concerns for vulvovaginal itching and changes to your vaginal discharge.  We have collected a cervicovaginal swab to assess for bacterial vaginosis, trichomonas, yeast, gonorrhea, chlamydia.  We will keep you updated with these results once they are available and medications indicated by those results will be sent to the pharmacy on file.  Please make sure that you are staying well-hydrated and avoid holding your urine for prolonged periods of time.  Please make sure that you are using natural fiber undergarments and changing your underwear frequently especially if you have exercised or exerted yourself.  Areas that are kept in a moist or dark environment such as the genital region without sufficient airflow can lead to more frequent yeast infections or skin irritations so it is important to make sure that the area is kept clean, dry and well ventilated.  If your symptoms do not appear to be improving after appropriate treatment please follow-up with your primary care provider for ongoing management.

## 2023-07-12 NOTE — ED Provider Notes (Signed)
 [facility]    CSN: [id number] Arrival date & time: 07/12/23  1118      History   Chief Complaint Chief Complaint  Patient presents with   Vaginal Itching    Entered by patient    HPI [facility] is a 28 y.o. female.   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    Anemia 11/06/2017   Started on iron BID   Asthma    Cholestasis during pregnancy 02/25/2018   Cholestasis during pregnancy in third trimester 03/08/2018   Elevated transaminase level 02/21/2018   Headache     Patient Active Problem List   Diagnosis Date Noted   Carrier of spinal muscular atrophy 09/14/2020   Supervision of low-risk pregnancy 08/26/2020   Pain of round ligament affecting pregnancy, antepartum 08/09/2020   Asthma 01/16/2018    Past Surgical History:  Procedure Laterality Date   NO PAST SURGERIES      OB History     Gravida  4   Para  3   Term  3   Preterm  0   AB  0   Living  3      SAB  0   IAB  0   Ectopic  0   Multiple  0   Live Births  3            Home Medications    Prior to Admission medications   Medication Sig Start Date End Date Taking? Authorizing Provider  albuterol (VENTOLIN HFA) 108 (90 Base)  MCG/ACT inhaler Inhale 1-2 puffs into the lungs every 6 (six) hours as needed for wheezing or shortness of breath. 03/07/23   Radford Pax, NP  benzonatate (TESSALON) 200 MG capsule Take 1 capsule (200 mg total) by mouth 3 (three) times daily as needed for cough. 04/16/23   Piontek, Denny Peon, MD  cetirizine (ZYRTEC ALLERGY) 10 MG tablet Take 1 tablet (10 mg total) by mouth daily. 11/13/22   Wallis Bamberg, PA-C  Docusate Sodium (DSS) 100 MG CAPS Take by mouth. 02/20/21   [provider]  Doxylamine-Pyridoxine 10-10 MG TBEC Take 2 tablets by mouth at bedtime. May also take 1 tab in am and 1 tab in afternoon 10/24/20   Donette Larry, CNM  famotidine (PEPCID) 20 MG tablet Take 1 tablet (20 mg total) by mouth at bedtime. 10/24/20   Donette Larry, CNM  hydrOXYzine (VISTARIL) 25 MG capsule Take one capsule 1 hour prior to bedtime.  If not able to fall asleep within 1 hour take one more capsule.  No more then two capsules in 24 hours. Patient not taking: Reported on 04/16/2023 12/22/21   [provider]  ondansetron (ZOFRAN-ODT) 4 MG  disintegrating tablet Take 1 tablet (4 mg total) by mouth every 8 (eight) hours as needed for nausea or vomiting. 06/06/23   Arabella Merles, PA-C  Prenatal Vit-Fe Fumarate-FA (PREPLUS) 27-1 MG TABS Take 1 tablet by mouth daily. 08/27/20   Bernerd Limbo, CNM  promethazine (PHENERGAN) 25 MG suppository Place 1 suppository (25 mg total) rectally every 6 (six) hours as needed for nausea or vomiting. Patient not taking: Reported on 04/16/2023 10/24/20   Donette Larry, CNM  promethazine-dextromethorphan (PROMETHAZINE-DM) 6.25-15 MG/5ML syrup Take 5 mLs by mouth 3 (three) times daily as needed for cough. 11/13/22   Wallis Bamberg, PA-C  pseudoephedrine (SUDAFED) 60 MG tablet Take 1 tablet (60 mg total) by mouth every 8 (eight) hours as needed for congestion. Patient not taking: Reported on 04/16/2023 11/13/22   Wallis Bamberg, PA-C  sertraline (ZOLOFT) 50 MG tablet Take by  mouth. Patient not taking: Reported on 04/16/2023 04/05/22   [provider]  ferrous sulfate 325 (65 FE) MG tablet Take 1 tablet (325 mg total) by mouth daily with breakfast. 03/09/19 07/07/19  Carrington Clamp, MD    Family History Family History  Problem Relation Age of Onset   Birth defects Neg Hx    Diabetes Neg Hx    Early death Neg Hx    Heart disease Neg Hx    Hypertension Neg Hx    Stroke Neg Hx    Miscarriages / Stillbirths Neg Hx     Social History Social History   Tobacco Use   Smoking status: Never   Smokeless tobacco: Never  Vaping Use   Vaping status: Never Used  Substance Use Topics   Alcohol use: No   Drug use: Not Currently    Types: Marijuana    Comment: used cocaine 1.5 yrs ago      Allergies   Metoclopramide and Scopolamine   Review of Systems Review of Systems  Constitutional:  Negative for chills and fever.  Gastrointestinal:  Negative for abdominal pain, diarrhea, nausea and vomiting.  Genitourinary:  Positive for dysuria and vaginal discharge. Negative for vaginal bleeding and vaginal pain.  Skin:  Positive for rash.     Physical Exam Triage Vital Signs ED Triage Vitals  Encounter Vitals Group     BP 07/12/23 1122 110/74     Systolic BP Percentile --      Diastolic BP Percentile --      Pulse Rate 07/12/23 1122 62     Resp 07/12/23 1122 16     Temp 07/12/23 1122 98.4 F (36.9 C)     Temp Source 07/12/23 1122 Oral     SpO2 07/12/23 1122 98 %     Weight 07/12/23 1122 140 lb (63.5 kg)     Height 07/12/23 1122 5\' 2"  (1.575 m)     Head Circumference --      Peak Flow --      Pain Score 07/12/23 1137 0     Pain Loc --      Pain Education --      Exclude from Growth Chart --    No data found.  Updated Vital Signs BP 110/74 (BP Location: Right Arm)   Pulse 62   Temp 98.4 F (36.9 C) (Oral)   Resp 16   Ht 5\' 2"  (1.575 m)   Wt 140 lb (63.5 kg)   LMP 06/21/2023 (Exact Date)   SpO2 98%   BMI 25.61 kg/m   Visual  Acuity Right Eye Distance:   Left Eye  Distance:   Bilateral Distance:    Right Eye Near:   Left Eye Near:    Bilateral Near:     Physical Exam Vitals reviewed.  Constitutional:      General: She is awake.     Appearance: Normal appearance. She is well-developed and well-groomed.  HENT:     Head: Normocephalic and atraumatic.  Eyes:     General: Lids are normal. Gaze aligned appropriately.     Extraocular Movements: Extraocular movements intact.     Conjunctiva/sclera: Conjunctivae normal.  Pulmonary:     Effort: Pulmonary effort is normal.  Neurological:     General: No focal deficit present.     Mental Status: She is alert and oriented to person, place, and time.     GCS: GCS eye subscore is 4. GCS verbal subscore is 5. GCS motor subscore is 6.     Cranial Nerves: No cranial nerve deficit, dysarthria or facial asymmetry.  Psychiatric:        Attention and Perception: Attention and perception normal.        Mood and Affect: Mood and affect normal.        Speech: Speech normal.        Behavior: Behavior normal. Behavior is cooperative.      UC Treatments / Results  Labs (all labs ordered are listed, but only abnormal results are displayed) Labs Reviewed  CERVICOVAGINAL ANCILLARY ONLY    EKG   Radiology No results found.  Procedures Procedures (including critical care time)  Medications Ordered in UC Medications - No data to display  Initial Impression / Assessment and Plan / UC Course  I have reviewed the triage vital signs and the nursing notes.  Pertinent labs & imaging results that were available during my care of the patient were reviewed by me and considered in my medical decision making (see chart for details).      Final Clinical Impressions(s) / UC Diagnoses   Final diagnoses:  Vaginal discharge  Vulvovaginal itching   Patient presents today with concerns for vulvovaginal itching and vaginal discharge changes.  She was previously seen for  similar concerns on 06/28/2023.  Her cervicovaginal swab at that time was positive for bacterial vaginosis.  She was sent in a course of Flagyl which she has not taken as directed.  She reports that she is still having yellow vaginal discharge and has developed vulvovaginal itching as well as of rash in the groin area.  Will repeat cervicovaginal swab to assess for BV, trichomonas, yeast, gonorrhea, chlamydia.  Reviewed that test results typically return in 1 to 2 days and she will be updated with those results once available.  Medications as indicated by test results will be sent to the pharmacy on file.  If symptoms are not improving after appropriate medication course recommend follow-up with primary care provider for ongoing management and monitoring.    Discharge Instructions      You were seen today for concerns for vulvovaginal itching and changes to your vaginal discharge.  We have collected a cervicovaginal swab to assess for bacterial vaginosis, trichomonas, yeast, gonorrhea, chlamydia.  We will keep you updated with these results once they are available and medications indicated by those results will be sent to the pharmacy on file.  Please make sure that you are staying well-hydrated and avoid holding your urine for prolonged periods of time.  Please make sure that you are using natural fiber undergarments and changing your underwear frequently especially if you  have exercised or exerted yourself.  Areas that are kept in a moist or dark environment such as the genital region without sufficient airflow can lead to more frequent yeast infections or skin irritations so it is important to make sure that the area is kept clean, dry and well ventilated.  If your symptoms do not appear to be improving after appropriate treatment please follow-up with your primary care provider for ongoing management.     ED Prescriptions   None    PDMP not reviewed this encounter.   Providence Crosby,  PA-C 07/12/23 1201

## 2023-07-17 ENCOUNTER — Telehealth (HOSPITAL_COMMUNITY): Payer: Self-pay

## 2023-07-17 LAB — CERVICOVAGINAL ANCILLARY ONLY
Bacterial Vaginitis (gardnerella): POSITIVE — AB
Candida Glabrata: NEGATIVE
Candida Vaginitis: POSITIVE — AB
Chlamydia: NEGATIVE
Comment: NEGATIVE
Comment: NEGATIVE
Comment: NEGATIVE
Comment: NEGATIVE
Comment: NEGATIVE
Comment: NORMAL
Neisseria Gonorrhea: NEGATIVE
Trichomonas: NEGATIVE

## 2023-07-17 MED ORDER — METRONIDAZOLE 500 MG PO TABS: 500.0000 mg | ORAL_TABLET | Freq: Two times a day (BID) | ORAL | 0 refills | Status: DC

## 2023-07-17 MED ORDER — FLUCONAZOLE 150 MG PO TABS: 150.0000 mg | ORAL_TABLET | Freq: Once | ORAL | 0 refills | Status: AC

## 2023-07-17 NOTE — Telephone Encounter (Signed)
 Per protocol, pt requires tx with metronidazole and Diflucan.  Rx sent to pharmacy on file.

## 2023-10-06 ENCOUNTER — Encounter (HOSPITAL_COMMUNITY): Payer: Self-pay

## 2023-10-06 ENCOUNTER — Ambulatory Visit (HOSPITAL_COMMUNITY)
Admission: EM | Admit: 2023-10-06 | Discharge: 2023-10-06 | Disposition: A | Discharge status: Home / Self Care | Attending: Emergency Medicine | Admitting: Emergency Medicine

## 2023-10-06 DIAGNOSIS — B309 Viral conjunctivitis, unspecified: Secondary | ICD-10-CM

## 2023-10-06 DIAGNOSIS — J069 Acute upper respiratory infection, unspecified: Secondary | ICD-10-CM

## 2023-10-06 MED ORDER — AZELASTINE HCL 0.1 % NA SOLN: 2.0000 | Freq: Two times a day (BID) | NASAL | 0 refills | Status: AC

## 2023-10-06 MED ORDER — OLOPATADINE HCL 0.1 % OP SOLN: 1.0000 [drp] | Freq: Two times a day (BID) | OPHTHALMIC | 0 refills | Status: AC

## 2023-10-06 MED ORDER — BENZONATATE 100 MG PO CAPS: 100.0000 mg | ORAL_CAPSULE | Freq: Three times a day (TID) | ORAL | 0 refills | Status: AC

## 2023-10-06 NOTE — Discharge Instructions (Signed)
 You can take Tessalon  every 8 hours as needed for cough. Use azelastine  nasal spray twice daily to help with congestion. Use Pataday  eyedrops twice daily to both eyes to help with irritation and drainage. Otherwise she can take over-the-counter Mucinex for cough and congestion. Alternate between 650 mg of Tylenol  and 400 mg of ibuprofen  every 6-8 hours as needed for sore throat, headache, or bodyaches. Make sure you are staying hydrated and getting plenty of rest. Return here for symptoms persist or worsen.

## 2023-10-06 NOTE — ED Provider Notes (Signed)
 [facility]    CSN: [id number] Arrival date & time: 10/06/23  1705      History   Chief Complaint Chief Complaint  Patient presents with   Nasal Congestion   Headache    HPI Fiza Nation is a 28 y.o. female.   Patient presents with cough, nasal congestion, runny nose, sore throat, and intermittent mild headache that began about 3 or 4 days ago.  Patient states that she has also had some bodyaches and chills.  Patient states that she recently flew to [place]  and back and then started to feel ill after this.  Denies known fever, chest pain, shortness of breath, nausea, vomiting, diarrhea, and abdominal pain.  Patient states that she has also had some bilateral eye itchiness with some clear to yellow drainage that began at the same time.  Denies blurred vision or photophobia.  Patient denies taking anything for her symptoms.  The history is provided by the patient and medical records.  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    Anemia 11/06/2017   Started on iron BID   Asthma    Cholestasis during pregnancy 02/25/2018   Cholestasis during pregnancy in third trimester 03/08/2018   Elevated transaminase level 02/21/2018   Headache     Patient Active Problem List   Diagnosis Date Noted   Carrier of spinal muscular atrophy 09/14/2020   Supervision of low-risk pregnancy 08/26/2020   Pain of round ligament affecting pregnancy, antepartum 08/09/2020   Asthma 01/16/2018    Past Surgical History:  Procedure Laterality Date   NO PAST SURGERIES      OB History      Gravida  4   Para  3   Term  3   Preterm  0   AB  0   Living  3      SAB  0   IAB  0   Ectopic  0   Multiple  0   Live Births  3            Home Medications    Prior to Admission medications   Medication Sig Start Date End Date Taking? Authorizing Provider  azelastine  (ASTELIN ) 0.1 % nasal spray Place 2 sprays into both nostrils 2 (two) times daily. Use in each nostril as directed 10/06/23  Yes Johnie Flaming A, NP  benzonatate  (TESSALON ) 100 MG capsule Take 1 capsule (100 mg total) by mouth every 8 (eight) hours. 10/06/23  Yes Johnie, Donovon Micheletti A, NP  olopatadine  (PATADAY ) 0.1 % ophthalmic solution Place 1 drop into both eyes 2 (two) times daily. 10/06/23  Yes Johnie Flaming A, NP  ferrous sulfate  325 (65 FE) MG tablet Take 1 tablet (325 mg total) by mouth daily with breakfast. 03/09/19 07/07/19  Sarrah Browning, MD    Family History Family History  Problem Relation Age of Onset   Birth defects Neg Hx    Diabetes Neg Hx    Early death Neg Hx    Heart disease Neg Hx    Hypertension Neg Hx    Stroke Neg Hx  Miscarriages / Stillbirths Neg Hx     Social History Social History   Tobacco Use   Smoking status: Never   Smokeless tobacco: Never  Vaping Use   Vaping status: Never Used  Substance Use Topics   Alcohol use: No   Drug use: Not Currently    Types: Marijuana    Comment: used cocaine 1.5 yrs ago      Allergies   Metoclopramide  and Scopolamine    Review of Systems Review of Systems  Neurological:  Positive for headaches.   Per HPI  Physical Exam Triage Vital Signs ED Triage Vitals  Encounter Vitals Group     BP 10/06/23 1723 105/68     Girls Systolic BP Percentile --      Girls Diastolic BP Percentile --      Boys Systolic BP Percentile --      Boys Diastolic BP Percentile --      Pulse Rate 10/06/23 1723 70     Resp 10/06/23 1723 18     Temp 10/06/23 1723 97.8 F (36.6 C)     Temp Source 10/06/23 1723 Oral     SpO2 10/06/23  1723 95 %     Weight --      Height 10/06/23 1723 5' 2 (1.575 m)     Head Circumference --      Peak Flow --      Pain Score 10/06/23 1722 5     Pain Loc --      Pain Education --      Exclude from Growth Chart --    No data found.  Updated Vital Signs BP 105/68 (BP Location: Left Arm)   Pulse 70   Temp 97.8 F (36.6 C) (Oral)   Resp 18   Ht 5' 2 (1.575 m)   LMP 09/06/2023 (Approximate)   SpO2 95%   BMI 25.61 kg/m   Visual Acuity Right Eye Distance:   Left Eye Distance:   Bilateral Distance:    Right Eye Near:   Left Eye Near:    Bilateral Near:     Physical Exam Vitals and nursing note reviewed.  Constitutional:      General: She is awake. She is not in acute distress.    Appearance: Normal appearance. She is well-developed and well-groomed. She is not ill-appearing.  HENT:     Right Ear: Tympanic membrane, ear canal and external ear normal.     Left Ear: Tympanic membrane, ear canal and external ear normal.     Nose: Congestion and rhinorrhea present.     Mouth/Throat:     Mouth: Mucous membranes are moist.     Pharynx: Posterior oropharyngeal erythema and postnasal drip present. No oropharyngeal exudate.  Eyes:     General: Lids are normal.     Conjunctiva/sclera: Conjunctivae normal.  Cardiovascular:     Rate and Rhythm: Normal rate and regular rhythm.  Pulmonary:     Effort: Pulmonary effort is normal.     Breath sounds: Normal breath sounds.  Skin:    General: Skin is warm and dry.  Neurological:     Mental Status: She is alert.  Psychiatric:        Behavior: Behavior is cooperative.      UC Treatments / Results  Labs (all labs ordered are listed, but only abnormal results are displayed) Labs Reviewed - No data to display  EKG   Radiology No results found.  Procedures Procedures (including critical care time)  Medications Ordered in  UC Medications - No data to display  Initial Impression / Assessment and Plan / UC Course  I have  reviewed the triage vital signs and the nursing notes.  Pertinent labs & imaging results that were available during my care of the patient were reviewed by me and considered in my medical decision making (see chart for details).     Patient is overall well-appearing.  Vitals are stable.  Upon assessment congestion and rhinorrhea present, mild erythema and PND noted to pharynx.  Exam of eyes unremarkable at this time.  Lungs clear bilaterally to auscultation.  Symptoms likely viral in nature.  Prescribed Tessalon  as needed for cough.  Prescribed azelastine  as needed for congestion.  Prescribed Pataday  eyedrops to help with irritation likely related to viral conjunctivitis.  Discussed over-the-counter medications for symptoms.  Discussed return precautions. Final Clinical Impressions(s) / UC Diagnoses   Final diagnoses:  Viral URI with cough  Acute viral conjunctivitis of both eyes     Discharge Instructions      You can take Tessalon  every 8 hours as needed for cough. Use azelastine  nasal spray twice daily to help with congestion. Use Pataday  eyedrops twice daily to both eyes to help with irritation and drainage. Otherwise she can take over-the-counter Mucinex for cough and congestion. Alternate between 650 mg of Tylenol  and 400 mg of ibuprofen  every 6-8 hours as needed for sore throat, headache, or bodyaches. Make sure you are staying hydrated and getting plenty of rest. Return here for symptoms persist or worsen.   ED Prescriptions     Medication Sig Dispense Auth. Provider   benzonatate  (TESSALON ) 100 MG capsule Take 1 capsule (100 mg total) by mouth every 8 (eight) hours. 21 capsule Johnie, Beata Beason A, NP   azelastine  (ASTELIN ) 0.1 % nasal spray Place 2 sprays into both nostrils 2 (two) times daily. Use in each nostril as directed 30 mL Johnie Flaming A, NP   olopatadine  (PATADAY ) 0.1 % ophthalmic solution Place 1 drop into both eyes 2 (two) times daily. 5 mL Johnie Flaming  A, NP      PDMP not reviewed this encounter.   Johnie Flaming A, NP 10/06/23 [phone]

## 2023-10-06 NOTE — ED Triage Notes (Signed)
 Chief Complaint: Headache, runny nose, sore throat, yellow eye drainage.   Sick exposure: No  Onset: 3-4 days   Prescriptions or OTC medications tried: No    New foods, medications, or products: No  Recent Travel: Yes- Flew to [place]  and back in one day.

## 2023-10-22 ENCOUNTER — Other Ambulatory Visit: Payer: Self-pay

## 2023-10-22 ENCOUNTER — Ambulatory Visit
Admission: RE | Admit: 2023-10-22 | Discharge: 2023-10-22 | Disposition: A | Discharge status: Home / Self Care | Attending: Physician Assistant

## 2023-10-22 VITALS — BP 112/76 | HR 72 | Temp 98.5°F | Resp 16 | Ht 62.0 in | Wt 125.0 lb

## 2023-10-22 DIAGNOSIS — Z113 Encounter for screening for infections with a predominantly sexual mode of transmission: Secondary | ICD-10-CM | POA: Insufficient documentation

## 2023-10-22 LAB — POCT URINALYSIS DIP (MANUAL ENTRY)
Blood, UA: NEGATIVE
Glucose, UA: NEGATIVE mg/dL
Ketones, POC UA: NEGATIVE mg/dL
Leukocytes, UA: NEGATIVE
Nitrite, UA: NEGATIVE
Spec Grav, UA: >=1.03 — AB (ref 1.010–1.025)
Urobilinogen, UA: 0.2 U/dL
pH, UA: 5.5 (ref 5.0–8.0)

## 2023-10-22 LAB — POCT URINE PREGNANCY: Preg Test, Ur: NEGATIVE

## 2023-10-22 NOTE — ED Triage Notes (Signed)
 Pt presents to urgent care for STD testing today. Denies any symptoms or pain. Requesting vaginal swab + blood work on today's visit.

## 2023-10-22 NOTE — ED Provider Notes (Signed)
 [facility]    CSN: [id number] Arrival date & time: 10/22/23  0919      History   Chief Complaint Chief Complaint  Patient presents with   SEXUALLY TRANSMITTED DISEASE    Stdntesting - Entered by patient    HPI Kathleen Cooke is a 28 y.o. female.   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    Anemia 11/06/2017   Started on iron BID   Asthma    Cholestasis during pregnancy 02/25/2018   Cholestasis during pregnancy in third trimester 03/08/2018   Elevated transaminase level 02/21/2018   Headache     Patient Active Problem List   Diagnosis Date Noted   Carrier of spinal muscular atrophy 09/14/2020   Supervision of low-risk pregnancy 08/26/2020   Pain of round ligament affecting pregnancy, antepartum 08/09/2020   Asthma 01/16/2018    Past Surgical History:  Procedure Laterality Date   NO PAST SURGERIES      OB History     Gravida  4   Para  3   Term  3   Preterm  0   AB  0   Living  3      SAB  0   IAB  0   Ectopic  0   Multiple  0   Live Births  3            Home Medications    Prior to Admission medications   Medication Sig Start Date End Date Taking? Authorizing Provider  azelastine  (ASTELIN ) 0.1 % nasal spray Place 2 sprays into both nostrils 2 (two) times daily. Use in each nostril as directed 10/06/23   Johnie Flaming A, NP  benzonatate   (TESSALON ) 100 MG capsule Take 1 capsule (100 mg total) by mouth every 8 (eight) hours. 10/06/23   Johnie Flaming A, NP  olopatadine  (PATADAY ) 0.1 % ophthalmic solution Place 1 drop into both eyes 2 (two) times daily. 10/06/23   Johnie Flaming A, NP  ferrous sulfate  325 (65 FE) MG tablet Take 1 tablet (325 mg total) by mouth daily with breakfast. 03/09/19 07/07/19  Sarrah Browning, MD    Family History Family History  Problem Relation Age of Onset   Birth defects Neg Hx    Diabetes Neg Hx    Early death Neg Hx    Heart disease Neg Hx    Hypertension Neg Hx    Stroke Neg Hx    Miscarriages / Stillbirths Neg Hx     Social History Social History   Tobacco Use   Smoking status: Never   Smokeless tobacco: Never  Vaping Use   Vaping status: Never Used  Substance Use Topics   Alcohol use: No   Drug use: Not Currently    Types: Marijuana    Comment: used cocaine 1.5 yrs ago      Allergies   Metoclopramide  and Scopolamine   Review of Systems Review of Systems  Constitutional:  Negative for chills and fever.  Genitourinary:  Negative for dysuria, flank pain, genital sores, vaginal bleeding, vaginal discharge and vaginal pain.  Skin:  Negative for rash.     Physical Exam Triage Vital Signs ED Triage Vitals  Encounter Vitals Group     BP 10/22/23 0947 112/76     Girls Systolic BP Percentile --      Girls Diastolic BP Percentile --      Boys Systolic BP Percentile --      Boys Diastolic BP Percentile --      Pulse Rate 10/22/23 0947 72     Resp 10/22/23 0947 16     Temp 10/22/23 0947 98.5 F (36.9 C)     Temp Source 10/22/23 0947 Oral     SpO2 10/22/23 0947 97 %     Weight 10/22/23 1002 125 lb (56.7 kg)     Height 10/22/23 0947 5' 2 (1.575 m)     Head Circumference --      Peak Flow --      Pain Score 10/22/23 1000 0     Pain Loc --      Pain Education --      Exclude from Growth Chart --    No data found.  Updated Vital Signs BP 112/76 (BP Location: Right  Arm)   Pulse 72   Temp 98.5 F (36.9 C) (Oral)   Resp 16   Ht 5' 2 (1.575 m)   Wt 125 lb (56.7 kg)   LMP 08/27/2023 (Exact Date)   SpO2 97%   BMI 22.86 kg/m   Visual Acuity Right Eye Distance:   Left Eye Distance:   Bilateral Distance:    Right Eye Near:   Left Eye Near:    Bilateral Near:     Physical Exam Vitals reviewed.  Constitutional:      General: She is awake.     Appearance: Normal appearance. She is well-developed and well-groomed.  HENT:     Head: Normocephalic and atraumatic.  Eyes:     General: Lids are normal. Gaze aligned appropriately.     Extraocular Movements: Extraocular movements intact.     Conjunctiva/sclera: Conjunctivae normal.  Pulmonary:     Effort: Pulmonary effort is normal.  Neurological:     Mental Status: She is alert and oriented to person, place, and time.  Psychiatric:        Attention and Perception: Attention and perception normal.        Mood and Affect: Mood and affect normal.        Speech: Speech normal.        Behavior: Behavior normal. Behavior is cooperative.      UC Treatments / Results  Labs (all labs ordered are listed, but only abnormal results are displayed) Labs Reviewed  POCT URINALYSIS DIP (MANUAL ENTRY) - Abnormal; Notable for the following components:      Result Value   Color, UA straw (*)    Clarity, UA cloudy (*)    Bilirubin, UA small (*)    Spec Grav, UA >=1.030 (*)    Protein Ur, POC trace (*)    All other components within normal limits  RPR  HIV ANTIBODY (ROUTINE TESTING W REFLEX)  POCT URINE PREGNANCY  CERVICOVAGINAL ANCILLARY ONLY    EKG   Radiology No results found.  Procedures Procedures (including critical care time)  Medications Ordered in UC Medications - No data to display  Initial Impression / Assessment and Plan / UC Course  I have reviewed the triage vital signs and the nursing notes.  Pertinent labs & imaging results that were available during my care of the patient  were reviewed by me and considered in my medical decision making (see chart for details).      Final Clinical Impressions(s) / UC Diagnoses   Final diagnoses:  Screening examination for STD (sexually transmitted disease)   Pt presents today for STD screening. She reports that she is sexually active with single female partner and denies knowledge of exposure or symptoms in partner. She would like blood work and swab today. Urine collected demonstrates some protein and small bilirubin but negative for signs of infection or hematuria. Urine preg negative. Blood work collected to assess for HIV and syphilis. Cervicovaginal swab collected for BV, yeast, trich, gonorrhea, chlamydia. Results to dictate further management. Follow up as needed for persistent or progressing symptoms      Discharge Instructions      You were seen today for STD screening.  We collected a cervicovaginal swab that we will assess for gonorrhea, chlamydia, trichomonas, bacterial vaginosis, yeast.  If collected blood work that we will assess for HIV and syphilis.  We will keep you updated with these results once they are available.  If any medications are indicated by those test results we will call you and medications will either be sent to the pharmacy on file or you can return to the urgent care for an injection.  It is recommended that you refrain from sexual activity until your test results are negative or until you have completed an appropriate medication regimen as dictated by your test results.  Please use a condom or another barrier method to help prevent STD transmission.  Please make sure that you communicate with your partners regarding your test results should any positive results, about as they will also need to be tested and screened.      ED Prescriptions   None    PDMP not reviewed this encounter.   Marylene Rocky BRAVO, PA-C 10/22/23 [id number]

## 2023-10-22 NOTE — Discharge Instructions (Signed)
 You were seen today for STD screening.  We collected a cervicovaginal swab that we will assess for gonorrhea, chlamydia, trichomonas, bacterial vaginosis, yeast.  If collected blood work that we will assess for HIV and syphilis.  We will keep you updated with these results once they are available.  If any medications are indicated by those test results we will call you and medications will either be sent to the pharmacy on file or you can return to the urgent care for an injection.  It is recommended that you refrain from sexual activity until your test results are negative or until you have completed an appropriate medication regimen as dictated by your test results.  Please use a condom or another barrier method to help prevent STD transmission.  Please make sure that you communicate with your partners regarding your test results should any positive results, about as they will also need to be tested and screened.

## 2023-10-23 LAB — CERVICOVAGINAL ANCILLARY ONLY
Bacterial Vaginitis (gardnerella): POSITIVE — AB
Candida Glabrata: NEGATIVE
Candida Vaginitis: NEGATIVE
Chlamydia: POSITIVE — AB
Comment: NEGATIVE
Comment: NEGATIVE
Comment: NEGATIVE
Comment: NEGATIVE
Comment: NEGATIVE
Comment: NORMAL
Neisseria Gonorrhea: NEGATIVE
Trichomonas: NEGATIVE

## 2023-10-23 LAB — HIV ANTIBODY (ROUTINE TESTING W REFLEX): HIV Screen 4th Generation wRfx: NONREACTIVE

## 2023-10-23 LAB — RPR: RPR Ser Ql: NONREACTIVE

## 2023-10-24 ENCOUNTER — Ambulatory Visit (HOSPITAL_COMMUNITY): Payer: Self-pay

## 2023-10-24 MED ORDER — DOXYCYCLINE HYCLATE 100 MG PO TABS: 100.0000 mg | ORAL_TABLET | Freq: Two times a day (BID) | ORAL | 0 refills | Status: AC

## 2023-11-25 ENCOUNTER — Encounter (HOSPITAL_COMMUNITY): Payer: Self-pay

## 2023-11-25 ENCOUNTER — Ambulatory Visit (HOSPITAL_COMMUNITY)
Admission: EM | Admit: 2023-11-25 | Discharge: 2023-11-25 | Disposition: A | Discharge status: Home / Self Care | Attending: Internal Medicine | Admitting: Internal Medicine

## 2023-11-25 DIAGNOSIS — J029 Acute pharyngitis, unspecified: Secondary | ICD-10-CM | POA: Diagnosis not present

## 2023-11-25 DIAGNOSIS — R0981 Nasal congestion: Secondary | ICD-10-CM

## 2023-11-25 DIAGNOSIS — J069 Acute upper respiratory infection, unspecified: Secondary | ICD-10-CM | POA: Diagnosis not present

## 2023-11-25 DIAGNOSIS — R051 Acute cough: Secondary | ICD-10-CM | POA: Diagnosis not present

## 2023-11-25 LAB — POCT RAPID STREP A (OFFICE): Rapid Strep A Screen: NEGATIVE

## 2023-11-25 LAB — POC SARS CORONAVIRUS 2 AG -  ED: SARS Coronavirus 2 Ag: NEGATIVE

## 2023-11-25 MED ORDER — PREDNISONE 20 MG PO TABS
40.0000 mg | ORAL_TABLET | Freq: Every day | ORAL | 0 refills | Status: AC
Start: 2023-11-25 — End: 2023-11-28

## 2023-11-25 MED ORDER — AMOXICILLIN 875 MG PO TABS: 875.0000 mg | ORAL_TABLET | Freq: Two times a day (BID) | ORAL | 0 refills | Status: AC

## 2023-11-25 MED ORDER — PROMETHAZINE-DM 6.25-15 MG/5ML PO SYRP: 5.0000 mL | ORAL_SOLUTION | Freq: Three times a day (TID) | ORAL | 0 refills | Status: AC | PRN

## 2023-11-25 NOTE — ED Provider Notes (Signed)
 [facility]    CSN: [id number] Arrival date & time: 11/25/23  1355      History   Chief Complaint Chief Complaint  Patient presents with   Sore Throat    HPI Kathleen Cooke is a 28 y.o. female.   28 year old female presents urgent care with complaints of bodyaches, sore throat, difficulty swallowing and changes in her voice.  She reports body aches started about a week ago.  This was also associated with a headache.  Yesterday she began to have a very sore throat followed by difficulty swallowing.  The sore throat has gotten worse.  She denies any known fevers but has felt hot and cold.  She works in [employer] and is exposed to a lot of different things during the week.  She also relates that her voice has gotten very coarse and it is difficult to speak.  She denies any nausea or vomiting.  She is also having some nasal congestion associated with her symptoms.   Sore Throat Pertinent negatives include no chest pain, no abdominal pain and no shortness of breath.    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    Anemia 11/06/2017   Started on iron BID   Asthma    Cholestasis during pregnancy 02/25/2018   Cholestasis during pregnancy in third trimester 03/08/2018   Elevated transaminase level 02/21/2018   Headache     Patient Active Problem List   Diagnosis Date Noted   Carrier of spinal muscular atrophy 09/14/2020   Supervision of low-risk pregnancy 08/26/2020   Pain of round ligament affecting pregnancy, antepartum 08/09/2020   Asthma 01/16/2018    Past Surgical History:   Procedure Laterality Date   NO PAST SURGERIES      OB History     Gravida  4   Para  3   Term  3   Preterm  0   AB  0   Living  3      SAB  0   IAB  0   Ectopic  0   Multiple  0   Live Births  3            Home Medications    Prior to Admission medications   Medication Sig Start Date End Date Taking? Authorizing Provider  amoxicillin  (AMOXIL ) 875 MG tablet Take 1 tablet (875 mg total) by mouth 2 (two) times daily for 7 days. 11/25/23 12/02/23 Yes Emorie Mcfate A, PA-C  predniSONE  (DELTASONE ) 20 MG tablet Take 2 tablets (40 mg total) by mouth daily with breakfast for 3 days. 11/25/23 11/28/23 Yes Bayden Gil A, PA-C  promethazine -dextromethorphan (PROMETHAZINE -DM) 6.25-15 MG/5ML syrup Take 5 mLs by mouth every 8 (eight) hours as needed for cough. 11/25/23  Yes Mykale Gandolfo A, PA-C  azelastine  (ASTELIN ) 0.1 % nasal spray Place 2 sprays into both nostrils 2 (two) times daily. Use in each nostril as directed Patient not taking: Reported on 11/25/2023 10/06/23   Johnie Flaming A, NP  benzonatate  (TESSALON ) 100 MG capsule Take 1 capsule (100 mg total) by mouth every 8 (eight) hours.  Patient not taking: Reported on 11/25/2023 10/06/23   Johnie Flaming A, NP  olopatadine  (PATADAY ) 0.1 % ophthalmic solution Place 1 drop into both eyes 2 (two) times daily. Patient not taking: Reported on 11/25/2023 10/06/23   Johnie Flaming LABOR, NP  ferrous sulfate  325 (65 FE) MG tablet Take 1 tablet (325 mg total) by mouth daily with breakfast. 03/09/19 07/07/19  Sarrah Browning, MD    Family History Family History  Problem Relation Age of Onset   Birth defects Neg Hx    Diabetes Neg Hx    Early death Neg Hx    Heart disease Neg Hx    Hypertension Neg Hx    Stroke Neg Hx    Miscarriages / Stillbirths Neg Hx     Social History Social History   Tobacco Use   Smoking status: Never   Smokeless tobacco: Never  Vaping Use   Vaping status: Never Used  Substance Use Topics    Alcohol use: No   Drug use: Not Currently    Types: Marijuana    Comment: used cocaine 1.5 yrs ago      Allergies   Metoclopramide  and Scopolamine    Review of Systems Review of Systems  Constitutional:  Positive for fever. Negative for chills.  HENT:  Positive for congestion, sore throat and trouble swallowing. Negative for ear pain.   Eyes:  Negative for pain and visual disturbance.  Respiratory:  Positive for cough. Negative for shortness of breath.   Cardiovascular:  Negative for chest pain and palpitations.  Gastrointestinal:  Negative for abdominal pain and vomiting.  Genitourinary:  Negative for dysuria and hematuria.  Musculoskeletal:  Positive for myalgias. Negative for arthralgias and back pain.  Skin:  Negative for color change and rash.  Neurological:  Negative for seizures and syncope.  All other systems reviewed and are negative.    Physical Exam Triage Vital Signs ED Triage Vitals [11/25/23 1501]  Encounter Vitals Group     BP 101/67     Girls Systolic BP Percentile      Girls Diastolic BP Percentile      Boys Systolic BP Percentile      Boys Diastolic BP Percentile      Pulse Rate 68     Resp 14     Temp 98.3 F (36.8 C)     Temp Source Oral     SpO2 96 %     Weight      Height      Head Circumference      Peak Flow      Pain Score 5     Pain Loc      Pain Education      Exclude from Growth Chart    No data found.  Updated Vital Signs BP 101/67 (BP Location: Right Arm)   Pulse 68   Temp 98.3 F (36.8 C) (Oral)   Resp 14   LMP 10/29/2023 (Approximate)   SpO2 96%   Visual Acuity Right Eye Distance:   Left Eye Distance:   Bilateral Distance:    Right Eye Near:   Left Eye Near:    Bilateral Near:     Physical Exam Vitals and nursing note reviewed.  Constitutional:      General: She is not in acute distress.    Appearance: She is well-developed. She is not ill-appearing.  HENT:     Head: Normocephalic and atraumatic.     Right  Ear: Tympanic membrane normal.     Left  Ear: Tympanic membrane normal.     Nose: Congestion present.     Mouth/Throat:     Mouth: Mucous membranes are moist.     Pharynx: Pharyngeal swelling and posterior oropharyngeal erythema present.  Eyes:     Conjunctiva/sclera: Conjunctivae normal.  Cardiovascular:     Rate and Rhythm: Normal rate and regular rhythm.     Heart sounds: No murmur heard. Pulmonary:     Effort: Pulmonary effort is normal. No respiratory distress.     Breath sounds: Normal breath sounds.  Abdominal:     Palpations: Abdomen is soft.     Tenderness: There is no abdominal tenderness.  Musculoskeletal:        General: No swelling.     Cervical back: Neck supple.  Skin:    General: Skin is warm and dry.     Capillary Refill: Capillary refill takes less than 2 seconds.  Neurological:     General: No focal deficit present.     Mental Status: She is alert.  Psychiatric:        Mood and Affect: Mood normal.      UC Treatments / Results  Labs (all labs ordered are listed, but only abnormal results are displayed) Labs Reviewed  POCT RAPID STREP A (OFFICE)  POC SARS CORONAVIRUS 2 AG -  ED    EKG   Radiology No results found.  Procedures Procedures (including critical care time)  Medications Ordered in UC Medications - No data to display  Initial Impression / Assessment and Plan / UC Course  I have reviewed the triage vital signs and the nursing notes.  Pertinent labs & imaging results that were available during my care of the patient were reviewed by me and considered in my medical decision making (see chart for details).     Sore throat - Plan: POC rapid strep A, POC SARS Coronavirus 2 Ag-ED - Nasal Swab, POC rapid strep A, POC SARS Coronavirus 2 Ag-ED - Nasal Swab  Nasal congestion - Plan: POC SARS Coronavirus 2 Ag-ED - Nasal Swab, POC SARS Coronavirus 2 Ag-ED - Nasal Swab  Acute cough  Acute upper respiratory infection  Strep testing and  COVID testing done today.  These are both negative.  Symptoms and physical exam findings are consistent with an upper respiratory infection.  Given the duration of time and severity of symptoms we will treat with antibiotics by mouth as well as steroids to help with inflammation.  We can also call in medication to help with the cough.  We will treat with the following: Amoxicillin  875 mg twice daily for 7 days.  This is an antibiotic.  Take this with food.  Promethazine  DM 5 mL every 8 hours as needed for cough.  Use caution as this medication can cause drowsiness. Prednisone  40 mg (2 tablets) once daily for 3 days. Take this in the morning.  This is a steroid to help with inflammation and pain. Make sure to stay hydrated by drinking plenty of water. Return to urgent care or PCP if symptoms worsen or fail to resolve.    Final Clinical Impressions(s) / UC Diagnoses   Final diagnoses:  Sore throat  Nasal congestion  Acute cough  Acute upper respiratory infection     Discharge Instructions      Strep testing and COVID testing done today.  These are both negative.  Symptoms and physical exam findings are consistent with an upper respiratory infection.  Given the duration of time and severity of  symptoms we will treat with antibiotics by mouth as well as steroids to help with inflammation.  We can also call in medication to help with the cough.  We will treat with the following: Amoxicillin  875 mg twice daily for 7 days.  This is an antibiotic.  Take this with food.  Promethazine  DM 5 mL every 8 hours as needed for cough.  Use caution as this medication can cause drowsiness. Prednisone  40 mg (2 tablets) once daily for 3 days. Take this in the morning.  This is a steroid to help with inflammation and pain. Make sure to stay hydrated by drinking plenty of water. Return to urgent care or PCP if symptoms worsen or fail to resolve.       ED Prescriptions     Medication Sig Dispense Auth.  Provider   amoxicillin  (AMOXIL ) 875 MG tablet Take 1 tablet (875 mg total) by mouth 2 (two) times daily for 7 days. 14 tablet Admiral Marcucci A, PA-C   promethazine -dextromethorphan (PROMETHAZINE -DM) 6.25-15 MG/5ML syrup Take 5 mLs by mouth every 8 (eight) hours as needed for cough. 180 mL Timmie Calix A, PA-C   predniSONE  (DELTASONE ) 20 MG tablet Take 2 tablets (40 mg total) by mouth daily with breakfast for 3 days. 6 tablet Teresa Almarie LABOR, PA-C      PDMP not reviewed this encounter.   Teresa Almarie LABOR, PA-C 11/25/23 1615

## 2023-11-25 NOTE — ED Triage Notes (Signed)
 Patient reports that she had generalized body aches and a headache throughout the week and yesterday she began having a sore throat and hoarseness.  Patient denies any any medications for her symptoms.

## 2023-11-25 NOTE — Discharge Instructions (Addendum)
 Strep testing and COVID testing done today.  These are both negative.  Symptoms and physical exam findings are consistent with an upper respiratory infection.  Given the duration of time and severity of symptoms we will treat with antibiotics by mouth as well as steroids to help with inflammation.  We can also call in medication to help with the cough.  We will treat with the following: Amoxicillin  875 mg twice daily for 7 days.  This is an antibiotic.  Take this with food.  Promethazine  DM 5 mL every 8 hours as needed for cough.  Use caution as this medication can cause drowsiness. Prednisone  40 mg (2 tablets) once daily for 3 days. Take this in the morning.  This is a steroid to help with inflammation and pain. Make sure to stay hydrated by drinking plenty of water. Return to urgent care or PCP if symptoms worsen or fail to resolve.

## 2024-01-02 ENCOUNTER — Ambulatory Visit (HOSPITAL_COMMUNITY)
Admission: EM | Admit: 2024-01-02 | Discharge: 2024-01-02 | Disposition: A | Discharge status: Home / Self Care | Attending: Emergency Medicine | Admitting: Emergency Medicine

## 2024-01-02 ENCOUNTER — Encounter (HOSPITAL_COMMUNITY): Payer: Self-pay

## 2024-01-02 DIAGNOSIS — R1030 Lower abdominal pain, unspecified: Secondary | ICD-10-CM | POA: Diagnosis not present

## 2024-01-02 DIAGNOSIS — R42 Dizziness and giddiness: Secondary | ICD-10-CM

## 2024-01-02 DIAGNOSIS — R509 Fever, unspecified: Secondary | ICD-10-CM | POA: Diagnosis not present

## 2024-01-02 LAB — POCT URINALYSIS DIP (MANUAL ENTRY)
Bilirubin, UA: NEGATIVE
Glucose, UA: NEGATIVE mg/dL
Ketones, POC UA: NEGATIVE mg/dL
Leukocytes, UA: NEGATIVE
Nitrite, UA: NEGATIVE
Protein Ur, POC: NEGATIVE mg/dL
Spec Grav, UA: 1.025 (ref 1.010–1.025)
Urobilinogen, UA: 0.2 U/dL
pH, UA: 6 (ref 5.0–8.0)

## 2024-01-02 LAB — POCT URINE PREGNANCY: Preg Test, Ur: NEGATIVE

## 2024-01-02 LAB — POCT FASTING CBG KUC MANUAL ENTRY: POCT Glucose (KUC): 108 mg/dL — AB (ref 70–99)

## 2024-01-02 NOTE — ED Triage Notes (Addendum)
 Pt states dizziness after eating about 3 hours ago.  Also states her ovaries hurt for the past 5 years.

## 2024-01-02 NOTE — Discharge Instructions (Signed)
 Please go to the emergency department for further evaluation of your abdominal pain and fever.

## 2024-01-02 NOTE — ED Notes (Signed)
 Patient is being discharged from the Urgent Care and sent to the Emergency Department via private vehicle . Per provider, patient is in need of higher level of care due to abd pain with fever. Patient is aware and verbalizes understanding of plan of care.  Vitals:   01/02/24 1953  BP: 111/75  Pulse: 96  Resp: 16  Temp: 100.1 F (37.8 C)  SpO2: 98%

## 2024-01-02 NOTE — ED Provider Notes (Signed)
 [facility]    CSN: [id number] Arrival date & time: 01/02/24  1919      History   Chief Complaint Chief Complaint  Patient presents with   Dizziness    HPI Kathleen Cooke is a 28 y.o. female.   Patient presents with dizziness that began about 3 hours prior to arrival after after eating.  Patient states that she is also had some nausea since then as well.  Patient states that she has history of chronic bilateral lower abdominal pain for years after she had her son, but states that the pain began again today and is worse on her right side than her left.  Patient denies any vomiting, diarrhea, constipation, and blood in stool.  The history is provided by the patient and medical records.  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    Anemia 11/06/2017   Started on iron BID   Asthma    Cholestasis during pregnancy 02/25/2018   Cholestasis during pregnancy in third trimester 03/08/2018   Elevated transaminase level 02/21/2018   Headache     Patient Active Problem List   Diagnosis Date Noted   Carrier of spinal muscular atrophy 09/14/2020   Supervision of low-risk pregnancy 08/26/2020   Pain of round ligament affecting pregnancy, antepartum 08/09/2020   Asthma 01/16/2018    Past Surgical History:  Procedure Laterality Date   NO PAST SURGERIES      OB History     Gravida  4   Para  3   Term  3   Preterm  0   AB  0   Living  3      SAB  0   IAB  0   Ectopic  0   Multiple  0   Live Births  3            Home Medications    Prior to  Admission medications   Medication Sig Start Date End Date Taking? Authorizing Provider  azelastine  (ASTELIN ) 0.1 % nasal spray Place 2 sprays into both nostrils 2 (two) times daily. Use in each nostril as directed Patient not taking: Reported on 11/25/2023 10/06/23   Johnie Flaming A, NP  benzonatate  (TESSALON ) 100 MG capsule Take 1 capsule (100 mg total) by mouth every 8 (eight) hours. Patient not taking: Reported on 11/25/2023 10/06/23   Johnie Flaming A, NP  olopatadine  (PATADAY ) 0.1 % ophthalmic solution Place 1 drop into both eyes 2 (two) times daily. Patient not taking: Reported on 11/25/2023 10/06/23   Johnie Flaming A, NP  promethazine -dextromethorphan (PROMETHAZINE -DM) 6.25-15 MG/5ML syrup Take 5 mLs by mouth every 8 (eight) hours as needed for cough. 11/25/23   Teresa Almarie LABOR, PA-C  ferrous sulfate  325 (65 FE) MG tablet Take 1 tablet (325 mg total) by mouth daily with breakfast. 03/09/19 07/07/19  Sarrah Browning, MD    Family History Family History  Problem Relation Age of Onset   Birth defects Neg Hx    Diabetes Neg Hx    Early death Neg Hx    Heart disease Neg Hx  Hypertension Neg Hx    Stroke Neg Hx    Miscarriages / Stillbirths Neg Hx     Social History Social History   Tobacco Use   Smoking status: Never   Smokeless tobacco: Never  Vaping Use   Vaping status: Never Used  Substance Use Topics   Alcohol use: No   Drug use: Not Currently    Types: Marijuana    Comment: used cocaine 1.5 yrs ago      Allergies   Metoclopramide  and Scopolamine    Review of Systems Review of Systems  Neurological:  Positive for dizziness.   Per HPI  Physical Exam Triage Vital Signs ED Triage Vitals  Encounter Vitals Group     BP 01/02/24 1953 111/75     Girls Systolic BP Percentile --      Girls Diastolic BP Percentile --      Boys Systolic BP Percentile --      Boys Diastolic BP Percentile --      Pulse Rate 01/02/24 1953 96     Resp 01/02/24 1953 16     Temp  01/02/24 1953 100.1 F (37.8 C)     Temp Source 01/02/24 1953 Oral     SpO2 01/02/24 1953 98 %     Weight --      Height --      Head Circumference --      Peak Flow --      Pain Score 01/02/24 1952 9     Pain Loc --      Pain Education --      Exclude from Growth Chart --    No data found.  Updated Vital Signs BP 111/75 (BP Location: Right Arm)   Pulse 96   Temp 100.1 F (37.8 C) (Oral)   Resp 16   LMP 12/19/2023 (Approximate)   SpO2 98%   Visual Acuity Right Eye Distance:   Left Eye Distance:   Bilateral Distance:    Right Eye Near:   Left Eye Near:    Bilateral Near:     Physical Exam Vitals and nursing note reviewed.  Constitutional:      General: She is awake. She is not in acute distress.    Appearance: Normal appearance. She is well-developed and well-groomed. She is ill-appearing. She is not toxic-appearing or diaphoretic.  Abdominal:     General: Abdomen is flat. Bowel sounds are normal.     Palpations: Abdomen is soft.     Tenderness: There is abdominal tenderness in the right lower quadrant and left lower quadrant. Positive signs include McBurney's sign and obturator sign. Negative signs include Rovsing's sign.     Comments: Tenderness noted to right lower quadrant and left lower quadrant.  Reports worse tenderness to right lower quadrant  Skin:    General: Skin is warm and dry.  Neurological:     Mental Status: She is alert.  Psychiatric:        Behavior: Behavior is cooperative.      UC Treatments / Results  Labs (all labs ordered are listed, but only abnormal results are displayed) Labs Reviewed  POCT URINALYSIS DIP (MANUAL ENTRY) - Abnormal; Notable for the following components:      Result Value   Blood, UA trace-intact (*)    All other components within normal limits  POCT FASTING CBG [facility] MANUAL ENTRY - Abnormal; Notable for the following components:   POCT Glucose ([facility]) 108 (*)    All other components within normal limits  POCT URINE  PREGNANCY    EKG   Radiology No results found.  Procedures Procedures (including critical care time)  Medications Ordered in UC Medications - No data to display  Initial Impression / Assessment and Plan / UC Course  I have reviewed the triage vital signs and the nursing notes.  Pertinent labs & imaging results that were available during my care of the patient were reviewed by me and considered in my medical decision making (see chart for details).     Patient is mildly ill-appearing.  Vitals are stable.  Temperature is elevated at 100.1.  Urinalysis unremarkable, UPT negative.  CBC within normal limits.  Tenderness noted to right and left lower quadrants with more significant tenderness noted to McBurney sign and obturator sign.  Recommended patient be seen in the emergency department for further evaluation due to new onset of lower abdominal pain with fever, dizziness, and nausea.  Patient is understanding and agreeable to plan at this time.  Patient is stable to arrive to the ER via POV. Final Clinical Impressions(s) / UC Diagnoses   Final diagnoses:  Dizziness  Lower abdominal pain  Fever, unspecified fever cause     Discharge Instructions      Please go to the emergency department for further evaluation of your abdominal pain and fever.   ED Prescriptions   None    PDMP not reviewed this encounter.   Johnie Flaming A, NP 01/02/24 2040

## 2024-01-03 ENCOUNTER — Emergency Department (HOSPITAL_COMMUNITY)
Admission: EM | Admit: 2024-01-03 | Discharge: 2024-01-03 | Disposition: A | Discharge status: Home / Self Care | Attending: Emergency Medicine | Admitting: Emergency Medicine

## 2024-01-03 ENCOUNTER — Other Ambulatory Visit: Payer: Self-pay

## 2024-01-03 ENCOUNTER — Emergency Department (HOSPITAL_COMMUNITY)

## 2024-01-03 ENCOUNTER — Encounter (HOSPITAL_COMMUNITY): Payer: Self-pay

## 2024-01-03 DIAGNOSIS — R110 Nausea: Secondary | ICD-10-CM | POA: Diagnosis not present

## 2024-01-03 DIAGNOSIS — R1031 Right lower quadrant pain: Secondary | ICD-10-CM | POA: Diagnosis present

## 2024-01-03 DIAGNOSIS — R509 Fever, unspecified: Secondary | ICD-10-CM | POA: Insufficient documentation

## 2024-01-03 LAB — URINALYSIS, ROUTINE W REFLEX MICROSCOPIC
Bilirubin Urine: NEGATIVE
Glucose, UA: NEGATIVE mg/dL
Hgb urine dipstick: NEGATIVE
Ketones, ur: NEGATIVE mg/dL
Leukocytes,Ua: NEGATIVE
Nitrite: NEGATIVE
Protein, ur: NEGATIVE mg/dL
Specific Gravity, Urine: 1.026 (ref 1.005–1.030)
pH: 5 (ref 5.0–8.0)

## 2024-01-03 LAB — CBC
HCT: 37 % (ref 36.0–46.0)
Hemoglobin: 11.9 g/dL — ABNORMAL LOW (ref 12.0–15.0)
MCH: 25.9 pg — ABNORMAL LOW (ref 26.0–34.0)
MCHC: 32.2 g/dL (ref 30.0–36.0)
MCV: 80.4 fL (ref 80.0–100.0)
Platelets: 299 K/uL (ref 150–400)
RBC: 4.6 MIL/uL (ref 3.87–5.11)
RDW: 14.7 % (ref 11.5–15.5)
WBC: 6.4 K/uL (ref 4.0–10.5)
nRBC: 0 % (ref 0.0–0.2)

## 2024-01-03 LAB — COMPREHENSIVE METABOLIC PANEL WITH GFR
ALT: 12 U/L (ref 0–44)
AST: 21 U/L (ref 15–41)
Albumin: 3.7 g/dL (ref 3.5–5.0)
Alkaline Phosphatase: 70 U/L (ref 38–126)
Anion gap: 8 (ref 5–15)
BUN: 9 mg/dL (ref 6–20)
CO2: 24 mmol/L (ref 22–32)
Calcium: 8.7 mg/dL — ABNORMAL LOW (ref 8.9–10.3)
Chloride: 107 mmol/L (ref 98–111)
Creatinine, Ser: 0.56 mg/dL (ref 0.44–1.00)
GFR, Estimated: >60 mL/min (ref >60)
Glucose, Bld: 101 mg/dL — ABNORMAL HIGH (ref 70–99)
Potassium: 3.6 mmol/L (ref 3.5–5.1)
Sodium: 139 mmol/L (ref 135–145)
Total Bilirubin: 0.5 mg/dL (ref 0.0–1.2)
Total Protein: 7 g/dL (ref 6.5–8.1)

## 2024-01-03 LAB — HCG, SERUM, QUALITATIVE: Preg, Serum: NEGATIVE

## 2024-01-03 LAB — LIPASE, BLOOD: Lipase: 31 U/L (ref 11–51)

## 2024-01-03 MED ORDER — IOHEXOL 350 MG/ML SOLN
75.0000 mL | Freq: Once | INTRAVENOUS | Status: AC | PRN
  Administered 2024-01-03: 75 mL via INTRAVENOUS

## 2024-01-03 MED ORDER — KETOROLAC TROMETHAMINE 15 MG/ML IJ SOLN
15.0000 mg | Freq: Once | INTRAMUSCULAR | Status: AC
  Administered 2024-01-03: 15 mg via INTRAVENOUS
  Filled 2024-01-03: qty 1

## 2024-01-03 NOTE — Discharge Instructions (Signed)
 Take Tylenol  or Motrin  for pain  See your doctor for follow-up  Return to ER if you have severe abdominal pain or vomiting or fever

## 2024-01-03 NOTE — ED Provider Notes (Signed)
 [place] EMERGENCY DEPARTMENT AT [facility] Provider Note   CSN: [id number] Arrival date & time: 01/03/24  1657     Patient presents with: No chief complaint on file.   Kathleen Cooke is a 28 y.o. female.   HPI Patient is a 28 year old female with no significant PMH presenting to the ED with chief complaint of RLQ abdominal pain described as sharp intermittently without clear aggravating/alleviating factors.  Patient endorses nausea and subjective fever yesterday without emesis, now resolved.  She denies headache, vision changes, chest pain, nausea, vomiting, objective fevers, back pain, changes in urination, and changes in bowel movements.  Last BM last night.  Patient denies any dysuria or hematuria, or changes in urinary frequency.  She further denies any abnormal vaginal bleeding or discharge.    Prior to Admission medications   Medication Sig Start Date End Date Taking? Authorizing Provider  azelastine  (ASTELIN ) 0.1 % nasal spray Place 2 sprays into both nostrils 2 (two) times daily. Use in each nostril as directed Patient not taking: Reported on 11/25/2023 10/06/23   Johnie Flaming A, NP  benzonatate  (TESSALON ) 100 MG capsule Take 1 capsule (100 mg total) by mouth every 8 (eight) hours. Patient not taking: Reported on 11/25/2023 10/06/23   Johnie Flaming A, NP  olopatadine  (PATADAY ) 0.1 % ophthalmic solution Place 1 drop into both eyes 2 (two) times daily. Patient not taking: Reported on 11/25/2023 10/06/23   Johnie Flaming A, NP  promethazine -dextromethorphan (PROMETHAZINE -DM) 6.25-15 MG/5ML syrup Take 5 mLs by mouth every 8 (eight) hours as needed for cough. 11/25/23   Teresa Almarie LABOR, PA-C  ferrous sulfate  325 (65 FE) MG tablet Take 1 tablet (325 mg total) by mouth daily with breakfast. 03/09/19 07/07/19  Sarrah Browning, MD    Allergies: Metoclopramide  and Scopolamine     Review of Systems  Updated Vital Signs BP (!) 102/58   Pulse 65   Temp 98.5 F (36.9  C) (Oral)   Resp 18   Ht 5' 2 (1.575 m)   Wt 68 kg   LMP 12/19/2023 (Approximate)   SpO2 98%   BMI 27.44 kg/m   Physical Exam Constitutional:      Appearance: Normal appearance.  HENT:     Head: Normocephalic and atraumatic.     Right Ear: Ear canal and external ear normal.     Left Ear: Ear canal and external ear normal.     Nose: Nose normal.     Mouth/Throat:     Mouth: Mucous membranes are moist.     Pharynx: Oropharynx is clear.  Eyes:     Extraocular Movements: Extraocular movements intact.     Conjunctiva/sclera: Conjunctivae normal.     Pupils: Pupils are equal, round, and reactive to light.  Cardiovascular:     Rate and Rhythm: Normal rate and regular rhythm.     Pulses: Normal pulses.     Heart sounds: Normal heart sounds.  Pulmonary:     Effort: Pulmonary effort is normal.     Breath sounds: Normal breath sounds.  Abdominal:     General: Abdomen is flat.     Palpations: Abdomen is soft.     Tenderness: There is abdominal tenderness in the right lower quadrant. There is no right CVA tenderness, left CVA tenderness, guarding or rebound.  Musculoskeletal:        General: Normal range of motion.     Cervical back: Normal range of motion.  Skin:    General: Skin is warm and dry.  Capillary Refill: Capillary refill takes less than 2 seconds.  Neurological:     General: No focal deficit present.     Mental Status: She is alert and oriented to person, place, and time.     (all labs ordered are listed, but only abnormal results are displayed) Labs Reviewed  COMPREHENSIVE METABOLIC PANEL WITH GFR - Abnormal; Notable for the following components:      Result Value   Glucose, Bld 101 (*)    Calcium 8.7 (*)    All other components within normal limits  CBC - Abnormal; Notable for the following components:   Hemoglobin 11.9 (*)    MCH 25.9 (*)    All other components within normal limits  URINALYSIS, ROUTINE W REFLEX MICROSCOPIC - Abnormal; Notable for the  following components:   APPearance HAZY (*)    All other components within normal limits  HCG, SERUM, QUALITATIVE  LIPASE, BLOOD    EKG: None  Radiology: US  PELVIC COMPLETE W TRANSVAGINAL AND TORSION R/O Result Date: 01/03/2024 CLINICAL DATA:  Right lower quadrant pain EXAM: TRANSABDOMINAL AND TRANSVAGINAL ULTRASOUND OF PELVIS DOPPLER ULTRASOUND OF OVARIES TECHNIQUE: Both transabdominal and transvaginal ultrasound examinations of the pelvis were performed. Transabdominal technique was performed for global imaging of the pelvis including uterus, ovaries, adnexal regions, and pelvic cul-de-sac. It was necessary to proceed with endovaginal exam following the transabdominal exam to visualize the uterus endometrium adnexa. Color and duplex Doppler ultrasound was utilized to evaluate blood flow to the ovaries. COMPARISON:  CT 01/03/2024 FINDINGS: Uterus Measurements: 8.6 x 5.1 by 5 cm = volume: 114 mL. No fibroids or other mass visualized. Endometrium Thickness: 9.2 mm.  No focal abnormality visualized. Right ovary Measurements: 2.8 x 2.2 x 2.2 cm = volume: 6.8 mL. Normal appearance/no adnexal mass. Left ovary Measurements: 1.8 x 1.3 x 1.3 cm = volume: 1.6 mL. Normal appearance/no adnexal mass. Pulsed Doppler evaluation of both ovaries demonstrates normal low-resistance arterial and venous waveforms. Other findings No abnormal free fluid. IMPRESSION: Negative pelvic ultrasound. Electronically Signed   By: Luke Bun M.D.   On: 01/03/2024 20:34   CT ABDOMEN PELVIS W CONTRAST Result Date: 01/03/2024 EXAM: CT ABDOMEN AND PELVIS WITH CONTRAST 01/03/2024 07:22:00 PM TECHNIQUE: CT of the abdomen and pelvis was performed with the administration of 75 mL of iohexol (OMNIPAQUE) 350 MG/ML injection. Multiplanar reformatted images are provided for review. Automated exposure control, iterative reconstruction, and/or weight-based adjustment of the mA/kV was utilized to reduce the radiation dose to as low as  reasonably achievable. COMPARISON: None available. CLINICAL HISTORY: Concern for appendicitis. FINDINGS: LOWER CHEST: No acute abnormality. LIVER: The liver is unremarkable. GALLBLADDER AND BILE DUCTS: Cholelithiasis without evidence of acute cholecystitis. No biliary ductal dilatation. SPLEEN: No acute abnormality. PANCREAS: No acute abnormality. ADRENAL GLANDS: No acute abnormality. KIDNEYS, URETERS AND BLADDER: No stones in the kidneys or ureters. No hydronephrosis. No perinephric or periureteral stranding. Urinary bladder is decompressed, limiting its evaluation. 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 Cecum is located in the midline lower pelvis, with a normal appendix identified. Stomach demonstrates no acute abnormality. There is no bowel obstruction. PERITONEUM AND RETROPERITONEUM: No ascites. No free air. VASCULATURE: Aorta is normal in caliber. LYMPH NODES: No lymphadenopathy. REPRODUCTIVE ORGANS: No acute abnormality. BONES AND SOFT TISSUES: No acute osseous abnormality. No focal soft tissue abnormality. IMPRESSION: 1. No CT evidence of appendicitis. 2. Cholelithiasis without evidence of acute cholecystitis. Electronically signed by: Ozell  Delores MD 01/03/2024 07:29 PM EDT RP Workstation: [id number]     Procedures   Medications Ordered in the ED  ketorolac  (TORADOL ) 15 MG/ML injection 15 mg (15 mg Intravenous Given 01/03/24 1921)  iohexol (OMNIPAQUE) 350 MG/ML injection 75 mL (75 mLs Intravenous Contrast Given 01/03/24 1923)                                  Medical Decision Making Amount and/or Complexity of Data Reviewed Labs: ordered. Radiology: ordered.  Risk Prescription drug management.  28 year old female PMH as above presenting to the ED with RLQ abdominal pain x 4 years with subjective fevers and nausea without emesis yesterday, now resolved.  Patient denies any aggravating or alleviating  factors to abdominal pain.  She denies any active nausea.  She further denies any lower GI symptoms including diarrhea or constipation.  Last BM last night.  No urinary changes.  No abnormal vaginal bleeding or discharge.  On arrival, patient hemodynamically stable in no acute distress.  Normotensive.  Afebrile.  No tachycardia or tachypnea.  Saturating 100% on RA.  Patient overall nontoxic-appearing.  Abdominal examination significant for RLQ tenderness to palpation without rebound or guarding.  With concern for potential ovarian torsion v appendicitis, TVUS and CTAP with contrast ordered.  Given 15 mg IV Toradol  with symptomatic improvement upon reassessment.   UA noninfectious appearing.  Patient denying any urinary symptoms at this time.  Doubt UTI.  CMP without urgent electrolytic deficiency.  Renal function stable at baseline.  Urine pregnancy negative.  CBC without significant anemia or leukocytosis.  CTAP indicative of cholelithiasis without cholecystitis.  No radiographic evidence of appendicitis.  TVUS negative for evidence of acute abnormality, with specific attention to appropriate blood flow via pulse Doppler to bilateral ovaries.  At this time, patient hemodynamically stable and appropriate for discharge.  Favored etiology at this time is round ligament versus MSK pain.  Patient updated regarding all imaging and testing.  Strict return precautions discussed.  Final diagnoses:  RLQ abdominal pain    ED Discharge Orders     None          Brylei Pedley, Elsie, MD 01/03/24 2040    Patt Alm Macho, MD 01/03/24 2315

## 2024-01-03 NOTE — ED Triage Notes (Signed)
 Patient states she is having RLQ pain for years intermittently. UC sent her to the ED to rule out problems with ovary or appendix.

## 2024-04-21 ENCOUNTER — Encounter: Admitting: Obstetrics and Gynecology
